# Patient Record
Sex: Female | Born: 1970 | Race: White | Hispanic: No | Marital: Married | Smoking: Never smoker
Health system: Southern US, Community
[De-identification: ages and names within clinical notes are randomized; demographics above are authoritative.]

---

## 1999-12-07 ENCOUNTER — Other Ambulatory Visit: Admission: RE | Admit: 1999-12-07 | Discharge: 1999-12-07 | Payer: Self-pay | Admitting: Obstetrics & Gynecology

## 2000-03-15 ENCOUNTER — Encounter: Admission: RE | Admit: 2000-03-15 | Discharge: 2000-06-13 | Payer: Self-pay | Admitting: Obstetrics & Gynecology

## 2000-04-21 ENCOUNTER — Inpatient Hospital Stay (HOSPITAL_COMMUNITY): Admission: AD | Admit: 2000-04-21 | Discharge: 2000-04-21 | Payer: Self-pay | Admitting: *Deleted

## 2000-06-06 ENCOUNTER — Inpatient Hospital Stay (HOSPITAL_COMMUNITY): Admission: AD | Admit: 2000-06-06 | Discharge: 2000-06-09 | Payer: Self-pay | Admitting: Obstetrics & Gynecology

## 2000-07-11 ENCOUNTER — Other Ambulatory Visit: Admission: RE | Admit: 2000-07-11 | Discharge: 2000-07-11 | Payer: Self-pay | Admitting: Obstetrics & Gynecology

## 2001-08-16 ENCOUNTER — Other Ambulatory Visit: Admission: RE | Admit: 2001-08-16 | Discharge: 2001-08-16 | Payer: Self-pay | Admitting: Obstetrics & Gynecology

## 2002-10-09 ENCOUNTER — Other Ambulatory Visit: Admission: RE | Admit: 2002-10-09 | Discharge: 2002-10-09 | Payer: Self-pay | Admitting: Obstetrics & Gynecology

## 2003-11-14 ENCOUNTER — Other Ambulatory Visit: Admission: RE | Admit: 2003-11-14 | Discharge: 2003-11-14 | Payer: Self-pay | Admitting: *Deleted

## 2008-05-14 ENCOUNTER — Emergency Department (HOSPITAL_COMMUNITY): Admission: EM | Admit: 2008-05-14 | Discharge: 2008-05-14 | Payer: Self-pay | Admitting: Emergency Medicine

## 2011-01-22 NOTE — H&P (Signed)
[facility]  Patient:    Katelyn Robertson, Katelyn Robertson                       MRN: [id number] Adm. Date:  [id number] Attending:  Genia Del                         History and Physical  DATE OF BIRTH:                1970-11-17  CHIEF COMPLAINT:              Ms. Littrell is a 40 year old G 2, P 0, A 1, at [redacted] weeks gestation.  Expected date of delivery June 19, 2000, by last a menstrual period.  REASON FOR ADMISSION:         Induction for intrauterine growth restriction at the 3rd to 4th percentile.  HISTORY OF PRESENT ILLNESS:   Fetal movements positive.  No vaginal bleeding. No fluid leak.  No irregular uterine contractions.  No PIH symptoms.  The patient was followed with fetal well-being test twice a week, alternating NSTs and biophysical profile.  Interval growths were done every two weeks, showing an estimated fetal weight at the 3rd percentile.  The baby showed reasonable interval growth.  The last ultrasound showed a 4th percentile.  The amniotic fluid was normal.  Dopplers were normal.  The placenta was grade 3.  The patient was put on relative bedrest with high p.o. hydration and high protein diet.  She had a very well-controlled gestational diabetes mellitus with values always below 90 fasting, and below 120 postprandial.  PAST MEDICAL HISTORY:         Negative.  PAST SURGICAL HISTORY:        Positive for D&E in 1992, for a therapeutic abortion.  No complications.  PAST OBSTETRICAL HISTORY:      Same as surgical history.  PAST GYNECOLOGIC HISTORY:     Negative.  CURRENT MEDICATIONS:          Prenatal vitamins.  ALLERGIES:                    No known drug allergies.  SOCIAL HISTORY:               Married, a nonsmoker.  HISTORY OF PRESENT PREGNANCY: First trimester was unremarkable.  Labs in the first trimester showed hemoglobin 12.7, platelets 273, blood type Rh, A-positive, Rh antibodies negative.  RPR nonreactive.  HbsAg negative.   HIV nonreactive.  Rubella titer immune.  Pap test within normal limits.  Gonorrhea and Chlamydia negative.  Triple test was within normal limits.  An ultrasound at 20 weeks showed a review of anatomy was within normal limits. Normal placenta, and normal amniotic fluid.  Cervix was 3.7 cm long.  A one-hour GTT was abnormal at 24+ weeks, a three-hour GTT was abnormal at 35+ weeks.  She was put on a diabetic diet and blood sugars were followed closely. They were within normal limits throughout her pregnancy.  At 31+ weeks gestation an ultrasound showed an estimated fetal weight in the 3rd percentile.  Dopplers and amniotic fluid index were within normal limits.  She was then followed as mentioned in the history of present illness.  Her group-B Streptococcus at 35+ weeks was negative.  REVIEW OF SYSTEMS:            CONSTITUTIONAL:  Negative.  HEENT:  Negative. RESPIRATORY:  Negative.  CARDIOVASCULAR:  Negative.  GI:  Negative.  UROLOGIC: Negative.  NEUROLOGIC:  Negative.  MUSCULOSKELETAL:  Negative.  DERMATOLOGIC: Negative.  PHYSICAL EXAMINATION:  GENERAL:                      In no apparent distress.  VITAL SIGNS:                  Blood pressure 118/89 on admission, then diastolic in the 60s, pulse 75, respirations 20, temperature 97.7 degrees.  HEENT:                        Within normal limits.  LUNGS:                        Clear bilaterally.  HEART:                        S1, S2 normal.  No S3, no S4, no murmur.  ABDOMEN:                      Gravid with a uterine height of 34.0 cm, nontender, vertex presentation.  PELVIC:                       Vaginal examination on admission was 1.0 cm long, vertex presentation, membranes intact.  EXTREMITIES:                  Lower limbs normal.  No edema.  NEUROLOGIC:                   No clonus.  MONITORING:                   Showed baseline fetal heart rate in the 125-135 range, good ___________, no decelerations.  No regular uterine  contractions.  IMPRESSION:                   A 40 year old gravida 2, para 0, abortion 1, at                               [redacted] weeks gestation, with gestational diabetes                               class A1, well-controlled, here for induction                               for intrauterine growth restriction at the                               third to fourth percentile.  PLAN:                         Admit to labor and delivery.  Monitoring. Induction with Pitocin and then artificial rupture of membranes.  Will follow closely, expectant management, with a probable vaginal delivery. DD:  06/06/00 TD:  06/06/00 Job: [id number]

## 2011-06-09 LAB — POCT URINALYSIS DIP (DEVICE)
Glucose, UA: NEGATIVE
Ketones, ur: NEGATIVE
Protein, ur: NEGATIVE
Specific Gravity, Urine: <=1.005
pH: 5.5

## 2016-11-29 DIAGNOSIS — Z01419 Encounter for gynecological examination (general) (routine) without abnormal findings: Secondary | ICD-10-CM | POA: Diagnosis not present

## 2016-11-29 DIAGNOSIS — Z6820 Body mass index (BMI) 20.0-20.9, adult: Secondary | ICD-10-CM | POA: Diagnosis not present

## 2017-06-13 ENCOUNTER — Ambulatory Visit (INDEPENDENT_AMBULATORY_CARE_PROVIDER_SITE_OTHER): Payer: 59

## 2017-06-13 ENCOUNTER — Ambulatory Visit (HOSPITAL_COMMUNITY)
Admission: EM | Admit: 2017-06-13 | Discharge: 2017-06-13 | Disposition: A | Discharge status: Home / Self Care | Payer: 59 | Attending: Internal Medicine | Admitting: Internal Medicine

## 2017-06-13 ENCOUNTER — Encounter (HOSPITAL_COMMUNITY): Payer: Self-pay | Admitting: Emergency Medicine

## 2017-06-13 DIAGNOSIS — M5431 Sciatica, right side: Secondary | ICD-10-CM

## 2017-06-13 DIAGNOSIS — M545 Low back pain: Secondary | ICD-10-CM | POA: Diagnosis not present

## 2017-06-13 MED ORDER — MELOXICAM 7.5 MG PO TABS: 7.5000 mg | ORAL_TABLET | Freq: Two times a day (BID) | ORAL | 0 refills | Status: AC | PRN

## 2017-06-13 MED ORDER — PREDNISONE 10 MG PO TABS: 40.0000 mg | ORAL_TABLET | Freq: Every day | ORAL | 0 refills | Status: AC

## 2017-06-13 NOTE — ED Triage Notes (Signed)
Pt here for right sided lower back pain into buttocks x 5 days; pt denies obvious injury

## 2017-12-12 ENCOUNTER — Telehealth: Payer: Self-pay | Admitting: *Deleted

## 2017-12-12 MED ORDER — NORETHIN-ETH ESTRAD TRIPHASIC 0.5/0.75/1-35 MG-MCG PO TABS: 1.0000 | ORAL_TABLET | Freq: Every day | ORAL | 1 refills | Status: DC

## 2017-12-12 NOTE — Telephone Encounter (Signed)
Patient has annual scheduled on 03/21/18, [facility] chart has arrived, needs refill on Nortel 7/7/7 0.5-0.75/1-35 mcg. Rx sent.

## 2018-02-10 ENCOUNTER — Encounter: Payer: Self-pay | Admitting: Obstetrics & Gynecology

## 2018-03-21 ENCOUNTER — Encounter: Payer: Self-pay | Admitting: Obstetrics & Gynecology

## 2018-04-04 ENCOUNTER — Ambulatory Visit (INDEPENDENT_AMBULATORY_CARE_PROVIDER_SITE_OTHER): Payer: 59 | Admitting: Obstetrics & Gynecology

## 2018-04-04 ENCOUNTER — Encounter: Payer: Self-pay | Admitting: Obstetrics & Gynecology

## 2018-04-04 VITALS — BP 112/76 | Ht 63.0 in | Wt 113.0 lb

## 2018-04-04 DIAGNOSIS — Z01419 Encounter for gynecological examination (general) (routine) without abnormal findings: Secondary | ICD-10-CM

## 2018-04-04 DIAGNOSIS — R6882 Decreased libido: Secondary | ICD-10-CM

## 2018-04-04 DIAGNOSIS — Z3041 Encounter for surveillance of contraceptive pills: Secondary | ICD-10-CM | POA: Diagnosis not present

## 2018-04-04 MED ORDER — NORETHIN-ETH ESTRAD TRIPHASIC 0.5/0.75/1-35 MG-MCG PO TABS: 1.0000 | ORAL_TABLET | Freq: Every day | ORAL | 4 refills | Status: DC

## 2018-04-04 NOTE — Progress Notes (Signed)
Katelyn NewerJessica Carnegie 10/08/1970 [id number]   History:    47 y.o. G2P1A1L1 Married.  Son is 46 yo, just graduated HS, will work in [employer], interior remodeling with his dad.   RP:  Established patient presenting for annual gyn exam   HPI: Well on Nortrel 777.  Light menses, no BTB.  No pelvic pain.  Normal vaginal secretions.  No pain with IC.  C/O low libido.  Urine/BMs wnl.  Breasts wnl.  BMI 20.02.  Health labs with Fam MD.  Past medical history,surgical history, family history and social history were all reviewed and documented in the EPIC chart.  Gynecologic History Patient's last menstrual period was 03/30/2018. Contraception: OCP (estrogen/progesterone) Last Pap: 11/2016. Results were: Negative/HPV HR neg Last mammogram: 11/2014. Results were: Negative.  Will schedule now. Bone Density: Never Colonoscopy: Never  Obstetric History OB History  Gravida Para Term Preterm AB Living  2 1     1 1   SAB TAB Ectopic Multiple Live Births               # Outcome Date GA Lbr Len/2nd Weight Sex Delivery Anes PTL Lv  2 AB           1 Para              ROS: A ROS was performed and pertinent positives and negatives are included in the history.  GENERAL: No fevers or chills. HEENT: No change in vision, no earache, sore throat or sinus congestion. NECK: No pain or stiffness. CARDIOVASCULAR: No chest pain or pressure. No palpitations. PULMONARY: No shortness of breath, cough or wheeze. GASTROINTESTINAL: No abdominal pain, nausea, vomiting or diarrhea, melena or bright red blood per rectum. GENITOURINARY: No urinary frequency, urgency, hesitancy or dysuria. MUSCULOSKELETAL: No joint or muscle pain, no back pain, no recent trauma. DERMATOLOGIC: No rash, no itching, no lesions. ENDOCRINE: No polyuria, polydipsia, no heat or cold intolerance. No recent change in weight. HEMATOLOGICAL: No anemia or easy bruising or bleeding. NEUROLOGIC: No headache, seizures, numbness, tingling or weakness.  PSYCHIATRIC: No depression, no loss of interest in normal activity or change in sleep pattern.     Exam:   BP 112/76   Ht 5\' 3"  (1.6 m)   Wt 113 lb (51.3 kg)   LMP 03/30/2018 Comment: pill  BMI 20.02 kg/m   Body mass index is 20.02 kg/m.  General appearance : Well developed well nourished female. No acute distress HEENT: Eyes: no retinal hemorrhage or exudates,  Neck supple, trachea midline, no carotid bruits, no thyroidmegaly Lungs: Clear to auscultation, no rhonchi or wheezes, or rib retractions  Heart: Regular rate and rhythm, no murmurs or gallops Breast:Examined in sitting and supine position were symmetrical in appearance, no palpable masses or tenderness,  no skin retraction, no nipple inversion, no nipple discharge, no skin discoloration, no axillary or supraclavicular lymphadenopathy Abdomen: no palpable masses or tenderness, no rebound or guarding Extremities: no edema or skin discoloration or tenderness  Pelvic: Vulva: Normal             Vagina: No gross lesions or discharge  Cervix: No gross lesions or discharge  Uterus  AV, normal size, shape and consistency, non-tender and mobile  Adnexa  Without masses or tenderness  Anus: Normal   Assessment/Plan:  47 y.o. female for annual exam   1. Well female exam with routine gynecological exam Normal gynecologic exam.  Pap negative with negative high-risk HPV in March 2018.  Will repeat Pap test at 2 to  3 years.  Breast exam normal.  Will schedule screening mammogram at [facility] or the breast center now.  Health labs with family physician.  2. Encounter for surveillance of contraceptive pills Well on Nortrel 777.  No contraindication to birth control pills.  Prescription sent to pharmacy.  3. Low libido Counseling done on low libido.  Encouraged to communicate with husband and assume leading role.  If no improvement, may consider stopping birth control pill and trying ParaGard IUD to avoid blocking ovulation.  Information  and pamphlet on ParaGard given to patient  Other orders - norethindrone-ethinyl estradiol (NORTREL 7/7/7) 0.5/0.75/1-35 MG-MCG tablet; Take 1 tablet by mouth daily.  Counseling on above issues and coordination of care more than 50% for 10 minutes.  Genia Del MD, 12:07 PM 04/04/2018

## 2018-04-04 NOTE — Patient Instructions (Signed)
1. Well female exam with routine gynecological exam Normal gynecologic exam.  Pap negative with negative high-risk HPV in March 2018.  Will repeat Pap test at 2 to 3 years.  Breast exam normal.  Will schedule [facility] or the [facility] center now.  Health labs with family physician.  2. Encounter for surveillance of contraceptive pills Well on Nortrel 777.  No contraindication to birth control pills.  Prescription sent to pharmacy.  3. Low libido Counseling done on low libido.  Encouraged to communicate with husband and assume leading role.  If no improvement, may consider stopping birth control pill and trying ParaGard IUD to avoid blocking ovulation.  Information and pamphlet on ParaGard given to patient  Other orders - norethindrone-ethinyl estradiol (NORTREL 7/7/7) 0.5/0.75/1-35 MG-MCG tablet; Take 1 tablet by mouth daily.  Katelyn Robertson, it was a pleasure seeing you today!

## 2018-05-09 ENCOUNTER — Encounter: Payer: Self-pay | Admitting: Obstetrics & Gynecology

## 2018-05-09 ENCOUNTER — Ambulatory Visit: Payer: 59 | Admitting: Obstetrics & Gynecology

## 2018-05-09 VITALS — BP 114/70

## 2018-05-09 DIAGNOSIS — R300 Dysuria: Secondary | ICD-10-CM

## 2018-05-09 DIAGNOSIS — R3915 Urgency of urination: Secondary | ICD-10-CM

## 2018-05-09 MED ORDER — CIPROFLOXACIN HCL 500 MG PO TABS: 500.0000 mg | ORAL_TABLET | Freq: Two times a day (BID) | ORAL | 0 refills | Status: DC

## 2018-05-09 NOTE — Progress Notes (Signed)
    Katelyn Robertson 1971/04/13 [id number]        47 y.o.  G2P0011 Married  RP:   HPI: Frequency, urgency of urination with discomfort and blood in urine x yesterday.  Pressure sensation.  No fever.  No vaginal discharge, no itching, no odor.  Has been more sexually active than usual recently.   OB History  Gravida Para Term Preterm AB Living  2 1     1 1   SAB TAB Ectopic Multiple Live Births               # Outcome Date GA Lbr Len/2nd Weight Sex Delivery Anes PTL Lv  2 AB           1 Para             Past medical history,surgical history, problem list, medications, allergies, family history and social history were all reviewed and documented in the EPIC chart.   Directed ROS with pertinent positives and negatives documented in the history of present illness/assessment and plan.  Exam:  Vitals:   05/09/18 1139  BP: 114/70   General appearance:  Normal  CVAT negative bilaterally  Gynecologic exam: Deferred  U/A: Yellow slightly cloudy, protein 1+, nitrites positive, white blood cells 40-60, red blood cells 40-60, moderate bacteria.  Urine culture pending.   Assessment/Plan:  47 y.o. G2P0011   1. Dysuria Acute cystitis clinically and per urine analysis.  No evidence of acute pyelonephritis.  Decision to treat with Cipro ofloxacin 500 mg twice a day for 7 days.  Usage discussed with patient and prescription sent to pharmacy.  Recommend increasing water consumption.  Urine culture pending. - Urinalysis,Complete w/RFL Culture  2. Urgency of urination As above. - Urinalysis,Complete w/RFL Culture  Other orders - ciprofloxacin (CIPRO) 500 MG tablet; Take 1 tablet (500 mg total) by mouth 2 (two) times daily.  Counseling on above issues and coordination of care more than 50% for 15 minutes.  Genia Del MD, 11:47 AM 05/09/2018

## 2018-05-09 NOTE — Patient Instructions (Signed)
1. Dysuria Acute cystitis clinically and per urine analysis.  No evidence of acute pyelonephritis.  Decision to treat with Cipro ofloxacin 500 mg twice a day for 7 days.  Usage discussed with patient and prescription sent to pharmacy.  Recommend increasing water consumption.  Urine culture pending. - Urinalysis,Complete w/RFL Culture  2. Urgency of urination As above. - Urinalysis,Complete w/RFL Culture  Other orders - ciprofloxacin (CIPRO) 500 MG tablet; Take 1 tablet (500 mg total) by mouth 2 (two) times daily.  Katelyn Robertson, it was a pleasure seeing you today!  I will inform you of the urine culture as soon as the results are available.

## 2018-05-11 LAB — URINALYSIS, COMPLETE W/RFL CULTURE
Bilirubin Urine: NEGATIVE
Glucose, UA: NEGATIVE
HYALINE CAST: NONE SEEN /LPF
Nitrites, Initial: POSITIVE — AB
Specific Gravity, Urine: 1.025 (ref 1.001–1.03)
pH: 5 (ref 5.0–8.0)

## 2018-05-11 LAB — URINE CULTURE
MICRO NUMBER:: 91055545
SPECIMEN QUALITY: ADEQUATE

## 2018-05-11 LAB — CULTURE INDICATED

## 2018-09-19 ENCOUNTER — Telehealth: Payer: Self-pay | Admitting: *Deleted

## 2018-09-19 NOTE — Telephone Encounter (Signed)
Patient called stating Nortel 7/7/03-06-34 mcg is on back order, I did confirm with the pharmacy. Pharmacist is not sure when medication will return, patient will need to be prescribed another pill. She asked if pill could be similar to Nortel has been on for several years.  Please advise

## 2018-09-20 NOTE — Telephone Encounter (Signed)
Tri-Norinyl 28, Necon 7/7/7 and if none of those are available Tri-Sprintec.  Since starting late back up this month.,  Use backup method/ condoms this month.

## 2018-09-20 NOTE — Telephone Encounter (Signed)
Spoke with patient and the pharmacy gave her a Rx for Necon 7/7/7, patient will continue Rx, will use back up condoms this month.

## 2018-09-20 NOTE — Telephone Encounter (Signed)
(   Dr. Seymour Bars patient)Nancy please see the below note, patient has been off pill since Sunday, do you have any recommendations?

## 2019-02-06 ENCOUNTER — Encounter: Payer: Self-pay | Admitting: Women's Health

## 2019-02-06 ENCOUNTER — Ambulatory Visit: Payer: 59 | Admitting: Women's Health

## 2019-02-06 ENCOUNTER — Other Ambulatory Visit: Payer: Self-pay

## 2019-02-06 VITALS — BP 138/82

## 2019-02-06 DIAGNOSIS — R350 Frequency of micturition: Secondary | ICD-10-CM | POA: Diagnosis not present

## 2019-02-06 DIAGNOSIS — N3001 Acute cystitis with hematuria: Secondary | ICD-10-CM | POA: Diagnosis not present

## 2019-02-06 MED ORDER — SULFAMETHOXAZOLE-TRIMETHOPRIM 800-160 MG PO TABS: 1.0000 | ORAL_TABLET | Freq: Two times a day (BID) | ORAL | 0 refills | Status: DC

## 2019-02-06 MED ORDER — NITROFURANTOIN MACROCRYSTAL 50 MG PO CAPS: ORAL_CAPSULE | ORAL | 0 refills | Status: DC

## 2019-02-06 NOTE — Patient Instructions (Signed)
OTC cranberry supp   Urinary Tract Infection, Adult A urinary tract infection (UTI) is an infection of any part of the urinary tract. The urinary tract includes:  The kidneys.  The ureters.  The bladder.  The urethra. These organs make, store, and get rid of pee (urine) in the body. What are the causes? This is caused by germs (bacteria) in your genital area. These germs grow and cause swelling (inflammation) of your urinary tract. What increases the risk? You are more likely to develop this condition if:  You have a small, thin tube (catheter) to drain pee.  You cannot control when you pee or poop (incontinence).  You are female, and: ? You use these methods to prevent pregnancy: ? A medicine that kills sperm (spermicide). ? A device that blocks sperm (diaphragm). ? You have low levels of a female hormone (estrogen). ? You are pregnant.  You have genes that add to your risk.  You are sexually active.  You take antibiotic medicines.  You have trouble peeing because of: ? A prostate that is bigger than normal, if you are female. ? A blockage in the part of your body that drains pee from the bladder (urethra). ? A kidney stone. ? A nerve condition that affects your bladder (neurogenic bladder). ? Not getting enough to drink. ? Not peeing often enough.  You have other conditions, such as: ? Diabetes. ? A weak disease-fighting system (immune system). ? Sickle cell disease. ? Gout. ? Injury of the spine. What are the signs or symptoms? Symptoms of this condition include:  Needing to pee right away (urgently).  Peeing often.  Peeing small amounts often.  Pain or burning when peeing.  Blood in the pee.  Pee that smells bad or not like normal.  Trouble peeing.  Pee that is cloudy.  Fluid coming from the vagina, if you are female.  Pain in the belly or lower back. Other symptoms include:  Throwing up (vomiting).  No urge to eat.  Feeling mixed up  (confused).  Being tired and grouchy (irritable).  A fever.  Watery poop (diarrhea). How is this treated? This condition may be treated with:  Antibiotic medicine.  Other medicines.  Drinking enough water. Follow these instructions at home:  Medicines  Take over-the-counter and prescription medicines only as told by your doctor.  If you were prescribed an antibiotic medicine, take it as told by your doctor. Do not stop taking it even if you start to feel better. General instructions  Make sure you: ? Pee until your bladder is empty. ? Do not hold pee for a long time. ? Empty your bladder after sex. ? Wipe from front to back after pooping if you are a female. Use each tissue one time when you wipe.  Drink enough fluid to keep your pee pale yellow.  Keep all follow-up visits as told by your doctor. This is important. Contact a doctor if:  You do not get better after 1-2 days.  Your symptoms go away and then come back. Get help right away if:  You have very bad back pain.  You have very bad pain in your lower belly.  You have a fever.  You are sick to your stomach (nauseous).  You are throwing up. Summary  A urinary tract infection (UTI) is an infection of any part of the urinary tract.  This condition is caused by germs in your genital area.  There are many risk factors for a UTI. These  include having a small, thin tube to drain pee and not being able to control when you pee or poop.  Treatment includes antibiotic medicines for germs.  Drink enough fluid to keep your pee pale yellow. This information is not intended to replace advice given to you by your health care provider. Make sure you discuss any questions you have with your health care provider. Document Released: 02/09/2008 Document Revised: 03/02/2018 Document Reviewed: 03/02/2018 Elsevier Interactive Patient Education  2019 [employer]

## 2019-02-06 NOTE — Progress Notes (Signed)
48 year old MWF G2, P1 presents with complaint of increased urinary frequency, urgency, pain at end of stream of urination with burning throughout urination for the past 2 days.  History of UTIs with similar symptoms.  Denies vaginal discharge, abdominal/back pain or fever.  Used over-the-counter Azo minimal relief.  Same partner married many years has frequent intercourse.  States relates to intercourse.  Last UTI September 2019 but states has had 3  UTIs s this past year.  Regular monthly cycle on Ortho-Novum 7/7/7, cycle just ended.  No known  medical problems.  Exam: Appears well.  No CVAT.  Abdomen soft without rebound. UA: Trace ketones, +1 leukocytes, +2 blood, greater than 60 WBCs, 20-40 RBCs, 20-40 squamous epithelials,  many bacteria.  Recurrent UTIs  Plan: Septra twice daily for 3 days prescription, proper use given.   UTI prevention discussed.  Macrodantin 50 mg as needed with intercourse, reviewed importance of emptying bladder after intercourse and increasing water consumption.  Instructed to call if no relief of symptoms.

## 2019-02-09 LAB — URINALYSIS, COMPLETE W/RFL CULTURE
Bilirubin Urine: NEGATIVE
Glucose, UA: NEGATIVE
Hyaline Cast: NONE SEEN /LPF
Nitrites, Initial: POSITIVE — AB
Specific Gravity, Urine: 1.025 (ref 1.001–1.03)
WBC, UA: >=60 /HPF — AB (ref 0–5)
pH: 5.5 (ref 5.0–8.0)

## 2019-02-09 LAB — URINE CULTURE
MICRO NUMBER:: 532609
SPECIMEN QUALITY:: ADEQUATE

## 2019-02-09 LAB — CULTURE INDICATED

## 2019-04-26 ENCOUNTER — Other Ambulatory Visit: Payer: Self-pay | Admitting: Obstetrics & Gynecology

## 2019-04-26 NOTE — Telephone Encounter (Signed)
Annual exam scheduled 05/24/19.

## 2019-05-02 ENCOUNTER — Other Ambulatory Visit: Payer: Self-pay

## 2019-05-02 ENCOUNTER — Ambulatory Visit: Payer: 59 | Admitting: Adult Health

## 2019-05-02 VITALS — BP 149/82 | HR 66 | Temp 98.5°F | Ht 63.25 in | Wt 121.4 lb

## 2019-05-02 DIAGNOSIS — L301 Dyshidrosis [pompholyx]: Secondary | ICD-10-CM | POA: Insufficient documentation

## 2019-05-02 DIAGNOSIS — Z0000 Encounter for general adult medical examination without abnormal findings: Secondary | ICD-10-CM

## 2019-05-02 MED ORDER — TRIAMCINOLONE ACETONIDE 0.1 % EX CREA
1.0000 | TOPICAL_CREAM | Freq: Two times a day (BID) | CUTANEOUS | 0 refills | Status: DC
Start: 2019-05-02 — End: 2019-07-31

## 2019-05-02 NOTE — Assessment & Plan Note (Signed)
Please use Triamcinolone 0.1% cream twice daily.

## 2019-05-02 NOTE — Patient Instructions (Addendum)
Mediterranean Diet A Mediterranean diet refers to food and lifestyle choices that are based on the traditions of countries located on the [employer]. This way of eating has been shown to help prevent certain conditions and improve outcomes for people who have chronic diseases, like kidney disease and heart disease. What are tips for following this plan? Lifestyle  Cook and eat meals together with your family, when possible.  Drink enough fluid to keep your urine clear or pale yellow.  Be physically active every day. This includes: ? Aerobic exercise like running or swimming. ? Leisure activities like gardening, walking, or housework.  Get 7-8 hours of sleep each night.  If recommended by your health care provider, drink red wine in moderation. This means 1 glass a day for nonpregnant women and 2 glasses a day for men. A glass of wine equals 5 oz (150 mL). Reading food labels   Check the serving size of packaged foods. For foods such as rice and pasta, the serving size refers to the amount of cooked product, not dry.  Check the total fat in packaged foods. Avoid foods that have saturated fat or trans fats.  Check the ingredients list for added sugars, such as corn syrup. Shopping  At the grocery store, buy most of your food from the areas near the walls of the store. This includes: ? Fresh fruits and vegetables (produce). ? Grains, beans, nuts, and seeds. Some of these may be available in unpackaged forms or large amounts (in bulk). ? Fresh seafood. ? Poultry and eggs. ? Low-fat dairy products.  Buy whole ingredients instead of prepackaged foods.  Buy fresh fruits and vegetables in-season from local farmers markets.  Buy frozen fruits and vegetables in resealable bags.  If you do not have access to quality fresh seafood, buy precooked frozen shrimp or canned fish, such as tuna, salmon, or sardines.  Buy small amounts of raw or cooked vegetables, salads, or olives from  the deli or salad bar at your store.  Stock your pantry so you always have certain foods on hand, such as olive oil, canned tuna, canned tomatoes, rice, pasta, and beans. Cooking  Cook foods with extra-virgin olive oil instead of using butter or other vegetable oils.  Have meat as a side dish, and have vegetables or grains as your main dish. This means having meat in small portions or adding small amounts of meat to foods like pasta or stew.  Use beans or vegetables instead of meat in common dishes like chili or lasagna.  Experiment with different cooking methods. Try roasting or broiling vegetables instead of steaming or sauteing them.  Add frozen vegetables to soups, stews, pasta, or rice.  Add nuts or seeds for added healthy fat at each meal. You can add these to yogurt, salads, or vegetable dishes.  Marinate fish or vegetables using olive oil, lemon juice, garlic, and fresh herbs. Meal planning   Plan to eat 1 vegetarian meal one day each week. Try to work up to 2 vegetarian meals, if possible.  Eat seafood 2 or more times a week.  Have healthy snacks readily available, such as: ? Vegetable sticks with hummus. ? [place] yogurt. ? Fruit and nut trail mix.  Eat balanced meals throughout the week. This includes: ? Fruit: 2-3 servings a day ? Vegetables: 4-5 servings a day ? Low-fat dairy: 2 servings a day ? Fish, poultry, or lean meat: 1 serving a day ? Beans and legumes: 2 or more servings a week ?  Nuts and seeds: 1-2 servings a day ? Whole grains: 6-8 servings a day ? Extra-virgin olive oil: 3-4 servings a day  Limit red meat and sweets to only a few servings a month What are my food choices?  Mediterranean diet ? Recommended  Grains: Whole-grain pasta. Brown rice. Bulgar wheat. Polenta. Couscous. Whole-wheat bread. [place].  Vegetables: Artichokes. Beets. Broccoli. Cabbage. Carrots. Eggplant. Green beans. Chard. Kale. Spinach. Onions. Leeks. Peas. Squash.  Tomatoes. Peppers. Radishes.  Fruits: Apples. Apricots. Avocado. Berries. Bananas. Cherries. Dates. Figs. Grapes. Lemons. Melon. Oranges. Peaches. Plums. Pomegranate.  Meats and other protein foods: Beans. Almonds. Sunflower seeds. Pine nuts. Peanuts. [place]. Salmon. Scallops. Shrimp. [place]. Tilapia. Clams. Oysters. Eggs.  Dairy: Low-fat milk. Cheese. Greek yogurt.  Beverages: Water. Red wine. Herbal tea.  Fats and oils: Extra virgin olive oil. Avocado oil. Grape seed oil.  Sweets and desserts: [place] yogurt with honey. Baked apples. Poached pears. Trail mix.  Seasoning and other foods: Basil. Cilantro. Coriander. Cumin. Mint. Parsley. Sage. Rosemary. Tarragon. Garlic. Oregano. Thyme. Pepper. Balsalmic vinegar. Tahini. Hummus. Tomato sauce. Olives. Mushrooms. ? Limit these  Grains: Prepackaged pasta or rice dishes. Prepackaged cereal with added sugar.  Vegetables: Deep fried potatoes (french fries).  Fruits: Fruit canned in syrup.  Meats and other protein foods: Beef. Pork. Lamb. Poultry with skin. Hot dogs. [place] Salines.  Dairy: Ice cream. Sour cream. Whole milk.  Beverages: Juice. Sugar-sweetened soft drinks. Beer. Liquor and spirits.  Fats and oils: Butter. Canola oil. Vegetable oil. Beef fat (tallow). Lard.  Sweets and desserts: Cookies. Cakes. Pies. Candy.  Seasoning and other foods: Mayonnaise. Premade sauces and marinades. The items listed may not be a complete list. Talk with your dietitian about what dietary choices are right for you. Summary  The Mediterranean diet includes both food and lifestyle choices.  Eat a variety of fresh fruits and vegetables, beans, nuts, seeds, and whole grains.  Limit the amount of red meat and sweets that you eat.  Talk with your health care provider about whether it is safe for you to drink red wine in moderation. This means 1 glass a day for nonpregnant women and 2 glasses a day for men. A glass of wine equals 5 oz (150 mL). This information  is not intended to replace advice given to you by your health care provider. Make sure you discuss any questions you have with your health care provider. Document Released: 04/15/2016 Document Revised: 04/22/2016 Document Reviewed: 04/15/2016 Elsevier Patient Education  [place].  Increase water intake, strive for at least 65 ounces/day.   Follow Mediterranean diet Increase regular exercise.  Recommend at least 30 minutes daily, 5 days per week of walking, jogging, biking, swimming, YouTube/Pinterest workout videos. Please use Triamcinolone 0.1% cream twice daily. Please schedule physical in 4 weeks, fasting labs in 3 weeks. Continue to social distance and wear a mask when in public. WELCOME TO THE PRACTICE!

## 2019-05-02 NOTE — Assessment & Plan Note (Signed)
Increase water intake, strive for at least 65 ounces/day.   Follow Mediterranean diet Increase regular exercise.  Recommend at least 30 minutes daily, 5 days per week of walking, jogging, biking, swimming, YouTube/Pinterest workout videos. Please use Triamcinolone 0.1% cream twice daily. Please schedule physical in 4 weeks, fasting labs in 3 weeks. Continue to social distance and wear a mask when in public.

## 2019-05-02 NOTE — Progress Notes (Signed)
Subjective:    Patient ID: Katelyn Robertson, female    DOB: 08/28/1971, 48 y.o.   MRN: [id number]  HPI: Katelyn Robertson is here to establish as a new pt.  She is a pleasant 48 year old female. PMH: Elevated BP without dx of HTN, re-current UTI. She has had 3 UTIs in last 12 month period.  She is completing 3rd rd of ABXs now.  If she had another UTI, will refer to Urology. She estimates to only drink sips of water/day, prefers to hydrate on sweat tea and Mt. Dew. She eats a diet high in processed foods She denies regular exercise  She abstains from tobacco/vape/ETOH She has not had primary care in past, only regular OB/GYN care. On acute complaint- flaky, itchy skin on R foot x 1 month Patient Care Team    Relationship Specialty Notifications Start End  Esaw Grandchild, NP PCP - General Family Medicine  05/02/19     Patient Active Problem List   Diagnosis Date Noted  . Healthcare maintenance 05/02/2019  . Dyshidrotic foot dermatitis 05/02/2019     No past medical history on file.   No past surgical history on file.   Family History  Problem Relation Age of Onset  . Hypertension Father   . Breast cancer Maternal Grandmother      Social History   Substance and Sexual Activity  Drug Use No     Social History   Substance and Sexual Activity  Alcohol Use No     Social History   Tobacco Use  Smoking Status Never Smoker  Smokeless Tobacco Never Used     Outpatient Encounter Medications as of 05/02/2019  Medication Sig  . [name] 7/7/7 0.5/0.75/1-35 MG-MCG tablet Take 1 tablet by mouth once daily  . triamcinolone cream (KENALOG) 0.1 % Apply 1 application topically 2 (two) times daily.  . [DISCONTINUED] nitrofurantoin (MACRODANTIN) 50 MG capsule Take as needed with intercourse  . [DISCONTINUED] sulfamethoxazole-trimethoprim (BACTRIM DS) 800-160 MG tablet Take 1 tablet by mouth 2 (two) times daily.   No facility-administered encounter medications on file as of  05/02/2019.     Allergies: Patient has no known allergies.  Body mass index is 21.34 kg/m.  Blood pressure (!) 149/82, pulse 66, temperature 98.5 F (36.9 C), temperature source Oral, height 5' 3.25" (1.607 m), weight 121 lb 6.4 oz (55.1 kg), last menstrual period 04/22/2019, SpO2 100 %.   Review of Systems  Constitutional: Positive for fatigue. Negative for activity change, appetite change, chills, diaphoresis, fever and unexpected weight change.  HENT: Negative for congestion.   Eyes: Negative for visual disturbance.  Respiratory: Negative for cough, chest tightness, shortness of breath, wheezing and stridor.   Cardiovascular: Negative for chest pain, palpitations and leg swelling.  Gastrointestinal: Negative for abdominal distention, abdominal pain, blood in stool, constipation, diarrhea, nausea and vomiting.  Endocrine: Negative for cold intolerance, heat intolerance, polydipsia, polyphagia and polyuria.  Genitourinary: Negative for difficulty urinating and flank pain.  Musculoskeletal: Negative for arthralgias, back pain, gait problem, joint swelling, myalgias, neck pain and neck stiffness.  Skin: Positive for color change and rash. Negative for pallor and wound.  Neurological: Negative for dizziness and headaches.  Hematological: Negative for adenopathy. Does not bruise/bleed easily.  Psychiatric/Behavioral: Negative for agitation, behavioral problems, confusion, decreased concentration, dysphoric mood, hallucinations, self-injury, sleep disturbance and suicidal ideas. The patient is not nervous/anxious and is not hyperactive.        Objective:   Physical Exam Vitals signs and nursing note  reviewed.  Constitutional:      General: She is not in acute distress.    Appearance: Normal appearance. She is normal weight. She is not ill-appearing, toxic-appearing or diaphoretic.  HENT:     Head: Normocephalic and atraumatic.  Eyes:     Extraocular Movements: Extraocular movements  intact.     Conjunctiva/sclera: Conjunctivae normal.     Pupils: Pupils are equal, round, and reactive to light.  Cardiovascular:     Rate and Rhythm: Normal rate and regular rhythm.     Pulses: Normal pulses.     Heart sounds: Normal heart sounds. No murmur. No friction rub. No gallop.   Pulmonary:     Effort: Pulmonary effort is normal. No respiratory distress.     Breath sounds: Normal breath sounds. No stridor. No wheezing, rhonchi or rales.  Chest:     Chest wall: No tenderness.  Skin:    General: Skin is warm.     Capillary Refill: Capillary refill takes less than 2 seconds.     Findings: Erythema present.     Comments: R foot- erythema, peeling skin, papules     Neurological:     Mental Status: She is alert and oriented to person, place, and time.  Psychiatric:        Mood and Affect: Mood normal.        Behavior: Behavior normal.        Thought Content: Thought content normal.        Judgment: Judgment normal.           Assessment & Plan:   1. Healthcare maintenance   2. Dyshidrotic foot dermatitis     Healthcare maintenance Increase water intake, strive for at least 65 ounces/day.   Follow Mediterranean diet Increase regular exercise.  Recommend at least 30 minutes daily, 5 days per week of walking, jogging, biking, swimming, YouTube/Pinterest workout videos. Please use Triamcinolone 0.1% cream twice daily. Please schedule physical in 4 weeks, fasting labs in 3 weeks. Continue to social distance and wear a mask when in public.  Dyshidrotic foot dermatitis Please use Triamcinolone 0.1% cream twice daily.     FOLLOW-UP:  Return in about 4 weeks (around 05/30/2019) for CPE, Fasting Labs.

## 2019-05-08 ENCOUNTER — Other Ambulatory Visit: Payer: Self-pay

## 2019-05-08 ENCOUNTER — Encounter: Payer: Self-pay | Admitting: Obstetrics & Gynecology

## 2019-05-08 ENCOUNTER — Ambulatory Visit: Payer: 59 | Admitting: Obstetrics & Gynecology

## 2019-05-08 VITALS — BP 116/72

## 2019-05-08 DIAGNOSIS — N3000 Acute cystitis without hematuria: Secondary | ICD-10-CM

## 2019-05-08 DIAGNOSIS — R350 Frequency of micturition: Secondary | ICD-10-CM | POA: Diagnosis not present

## 2019-05-08 MED ORDER — CIPROFLOXACIN HCL 500 MG PO TABS: 500.0000 mg | ORAL_TABLET | Freq: Two times a day (BID) | ORAL | 0 refills | Status: AC

## 2019-05-08 MED ORDER — NITROFURANTOIN MONOHYD MACRO 100 MG PO CAPS: 100.0000 mg | ORAL_CAPSULE | Freq: Every day | ORAL | 12 refills | Status: AC

## 2019-05-08 NOTE — Patient Instructions (Signed)
1. Frequency of urination Acute cystitis per clinical symptoms and urine analysis results.  Urine culture pending.  Fourth episode in 1 year.  Will treat with ciprofloxacin 500 mg per mouth twice a day for 7 days.  Will then start on Macrobid prophylaxis 100 mg daily.  Usage reviewed and prescription sent to pharmacy. - Urinalysis,Complete w/RFL Culture  2. Urinary frequency As above.  3. Acute recurrent cystitis Decision to start on Macrobid prophylaxis 100 mg per mouth daily.  Prescription sent to pharmacy.  Postcoital recommendation reviewed with patient.  Other orders - ciprofloxacin (CIPRO) 500 MG tablet; Take 1 tablet (500 mg total) by mouth 2 (two) times daily for 7 days. - nitrofurantoin, macrocrystal-monohydrate, (MACROBID) 100 MG capsule; Take 1 capsule (100 mg total) by mouth daily.  Katelyn Robertson, it was a pleasure seeing you today!  I will inform you of your results as soon as they are available.

## 2019-05-08 NOTE — Progress Notes (Signed)
    Katelyn Robertson [birth year] [id number]        48 y.o.  G2P0011 Married  RP: Urinary frequency, urgency, pelvic pain x x a few days  HPI: 3 acute cystitis in the last year, last cystitis 02/2019 E. Coli.  Sexually active every 2 days.  C/O urinary frequency, urgency and pelvic pressure x a few days.  No blood in urine.  No fever.   OB History  Gravida Para Term Preterm AB Living  2 1     1 1   SAB TAB Ectopic Multiple Live Births               # Outcome Date GA Lbr Len/2nd Weight Sex Delivery Anes PTL Lv  2 AB           1 Para             Past medical history,surgical history, problem list, medications, allergies, family history and social history were all reviewed and documented in the EPIC chart.   Directed ROS with pertinent positives and negatives documented in the history of present illness/assessment and plan.  Exam:  Vitals:   05/08/19 1226  BP: 116/72   General appearance:  Normal  CVAT Negative bilaterally  Abdomen: Normal  Gynecologic exam: Deferred  U/A: Yellow clear, protein 1+, nitrites negative, white blood cells 40-60, red blood cells 10-20, moderate bacteria.  Urine culture pending.   Assessment/Plan:  48 y.o. G2P0011   1. Frequency of urination Acute cystitis per clinical symptoms and urine analysis results.  Urine culture pending.  Fourth episode in 1 year.  Will treat with ciprofloxacin 500 mg per mouth twice a day for 7 days.  Will then start on Macrobid prophylaxis 100 mg daily.  Usage reviewed and prescription sent to pharmacy. - Urinalysis,Complete w/RFL Culture  2. Urinary frequency As above.  3. Acute recurrent cystitis Decision to start on Macrobid prophylaxis 100 mg per mouth daily.  Prescription sent to pharmacy.  Postcoital recommendation reviewed with patient.  Other orders - ciprofloxacin (CIPRO) 500 MG tablet; Take 1 tablet (500 mg total) by mouth 2 (two) times daily for 7 days. - nitrofurantoin, macrocrystal-monohydrate,  (MACROBID) 100 MG capsule; Take 1 capsule (100 mg total) by mouth daily.  Counseling on above issues and coordination of care more than 50% for 25 minutes.  Princess Bruins MD, 12:32 PM 05/08/2019

## 2019-05-10 LAB — URINALYSIS, COMPLETE W/RFL CULTURE
Bilirubin Urine: NEGATIVE
Glucose, UA: NEGATIVE
Hyaline Cast: NONE SEEN /LPF
Ketones, ur: NEGATIVE
Nitrites, Initial: NEGATIVE
Specific Gravity, Urine: 1.025 (ref 1.001–1.03)
pH: 5 (ref 5.0–8.0)

## 2019-05-10 LAB — URINE CULTURE
MICRO NUMBER:: 839308
SPECIMEN QUALITY:: ADEQUATE

## 2019-05-10 LAB — CULTURE INDICATED

## 2019-05-16 ENCOUNTER — Encounter: Payer: Self-pay | Admitting: Adult Health

## 2019-05-23 ENCOUNTER — Other Ambulatory Visit: Payer: Self-pay

## 2019-05-24 ENCOUNTER — Ambulatory Visit (INDEPENDENT_AMBULATORY_CARE_PROVIDER_SITE_OTHER): Payer: 59 | Admitting: Obstetrics & Gynecology

## 2019-05-24 ENCOUNTER — Encounter: Payer: Self-pay | Admitting: Obstetrics & Gynecology

## 2019-05-24 VITALS — BP 118/70 | Ht 63.0 in | Wt 121.0 lb

## 2019-05-24 DIAGNOSIS — Z3041 Encounter for surveillance of contraceptive pills: Secondary | ICD-10-CM | POA: Diagnosis not present

## 2019-05-24 DIAGNOSIS — N3000 Acute cystitis without hematuria: Secondary | ICD-10-CM | POA: Diagnosis not present

## 2019-05-24 DIAGNOSIS — Z01419 Encounter for gynecological examination (general) (routine) without abnormal findings: Secondary | ICD-10-CM | POA: Diagnosis not present

## 2019-05-24 MED ORDER — PIRMELLA 7/7/7 0.5/0.75/1-35 MG-MCG PO TABS: 1.0000 | ORAL_TABLET | Freq: Every day | ORAL | 4 refills | Status: DC

## 2019-05-24 NOTE — Patient Instructions (Signed)
1. Encounter for routine gynecological examination with Papanicolaou smear of cervix Normal gynecologic exam.  Pap reflex done.  Breast exam normal.  Will schedule a screening mammogram at the breast center now.  Good body mass index at 21.43.  Recommend aerobic physical activities 5 times a week and weightlifting every 2 days.  Healthy nutrition.  Health labs with family nurse practitioner.  2. Encounter for surveillance of contraceptive pills Well on [name] 7/7/7.  No breakthrough bleeding.  No contraindication to continue.  Prescription sent to pharmacy.  3. Acute recurrent cystitis Well on Macrobid 100 mg 1 tablet daily for prophylaxis.  Other orders - norethindrone-ethinyl estradiol ([name] 7/7/7) 0.5/0.75/1-35 MG-MCG tablet; Take 1 tablet by mouth daily.  Katelyn Robertson, it was a pleasure seeing you today!  I will inform you of your results as soon as they are available.

## 2019-05-24 NOTE — Progress Notes (Signed)
Linzee Depaul 1970/10/04 [id number]   History:    48 y.o. G2P1A1L1 Married.  Son is 13 yo, working with his father, living with his girlfriend in his parents' basement.  RP:  Established patient presenting for annual gyn exam   HPI: Well on [place] 7/7/7.  No BTB.  No pelvic pain.  No bladder infection since started on ABprophylaxis.  No pain with IC.  Breasts normal.  BMI 21.43.  Labs with Fam NP.  Past medical history,surgical history, family history and social history were all reviewed and documented in the EPIC chart.  Gynecologic History Patient's last menstrual period was 05/20/2019. Contraception: OCP (estrogen/progesterone) Last Pap: 11/2016. Results were: Negative/HPV HR neg Last mammogram: 11/2014. Results were: Negative Bone Density: Never Colonoscopy: Never  Obstetric History OB History  Gravida Para Term Preterm AB Living  2 1     1 1   SAB TAB Ectopic Multiple Live Births               # Outcome Date GA Lbr Len/2nd Weight Sex Delivery Anes PTL Lv  2 AB           1 Para              ROS: A ROS was performed and pertinent positives and negatives are included in the history.  GENERAL: No fevers or chills. HEENT: No change in vision, no earache, sore throat or sinus congestion. NECK: No pain or stiffness. CARDIOVASCULAR: No chest pain or pressure. No palpitations. PULMONARY: No shortness of breath, cough or wheeze. GASTROINTESTINAL: No abdominal pain, nausea, vomiting or diarrhea, melena or bright red blood per rectum. GENITOURINARY: No urinary frequency, urgency, hesitancy or dysuria. MUSCULOSKELETAL: No joint or muscle pain, no back pain, no recent trauma. DERMATOLOGIC: No rash, no itching, no lesions. ENDOCRINE: No polyuria, polydipsia, no heat or cold intolerance. No recent change in weight. HEMATOLOGICAL: No anemia or easy bruising or bleeding. NEUROLOGIC: No headache, seizures, numbness, tingling or weakness. PSYCHIATRIC: No depression, no loss of interest in normal  activity or change in sleep pattern.     Exam:   BP 118/70   Ht 5\' 3"  (1.6 m)   Wt 121 lb (54.9 kg)   LMP 05/20/2019   BMI 21.43 kg/m   Body mass index is 21.43 kg/m.  General appearance : Well developed well nourished female. No acute distress HEENT: Eyes: no retinal hemorrhage or exudates,  Neck supple, trachea midline, no carotid bruits, no thyroidmegaly Lungs: Clear to auscultation, no rhonchi or wheezes, or rib retractions  Heart: Regular rate and rhythm, no murmurs or gallops Breast:Examined in sitting and supine position were symmetrical in appearance, no palpable masses or tenderness,  no skin retraction, no nipple inversion, no nipple discharge, no skin discoloration, no axillary or supraclavicular lymphadenopathy Abdomen: no palpable masses or tenderness, no rebound or guarding Extremities: no edema or skin discoloration or tenderness  Pelvic: Vulva: Normal             Vagina: No gross lesions or discharge  Cervix: No gross lesions or discharge.  Pap reflex done.  Uterus  AV, normal size, shape and consistency, non-tender and mobile  Adnexa  Without masses or tenderness  Anus: Normal   Assessment/Plan:  48 y.o. female for annual exam   1. Encounter for routine gynecological examination with Papanicolaou smear of cervix Normal gynecologic exam.  Pap reflex done.  Breast exam normal.  Will schedule a screening mammogram at the breast center now.  Good body mass  index at 21.43.  Recommend aerobic physical activities 5 times a week and weightlifting every 2 days.  Healthy nutrition.  Health labs with family nurse practitioner.  2. Encounter for surveillance of contraceptive pills Well on [name] 7/7/7.  No breakthrough bleeding.  No contraindication to continue.  Prescription sent to pharmacy.  3. Acute recurrent cystitis Well on Macrobid 100 mg 1 tablet daily for prophylaxis.  Other orders - norethindrone-ethinyl estradiol ([name] 7/7/7) 0.5/0.75/1-35 MG-MCG  tablet; Take 1 tablet by mouth daily.  Genia DelMarie-Lyne Gaither Biehn MD, 2:08 PM 05/24/2019

## 2019-05-24 NOTE — Addendum Note (Signed)
Addended by: Thurnell Garbe A on: 05/24/2019 03:23 PM   Modules accepted: Orders

## 2019-05-25 LAB — PAP IG W/ RFLX HPV ASCU

## 2019-06-14 NOTE — ED Provider Notes (Signed)
[facility]    CSN: [id number] Arrival date & time: 06/13/17  1645      History   Chief Complaint Chief Complaint  Patient presents with  . Back Pain    HPI Katelyn Robertson is a 48 y.o. female.   Pt with no chronic medical prolems c/o lower back pain x5 days.  Denies trauma or significant change in activity.  Denies LE weakness or loss of B/B control.      History reviewed. No pertinent past medical history.  Patient Active Problem List   Diagnosis Date Noted  . Healthcare maintenance 05/02/2019  . 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 No pertinent surgical history.  OB History    Gravida  2   Para  1   Term      Preterm      AB  1   Living  1     SAB      TAB      Ectopic      Multiple      Live Births               Home Medications    Prior to Admission medications   Medication Sig Start Date End Date Taking? Authorizing Provider  norethindrone-ethinyl estradiol ([name] 7/7/7) 0.5/0.75/1-35 MG-MCG tablet Take 1 tablet by mouth daily. 05/24/19   Genia Del, MD  triamcinolone cream (KENALOG) 0.1 % Apply 1 application topically 2 (two) times daily. Patient not taking: Reported on 05/24/2019 05/02/19   Julaine Fusi, NP    Family History Family History  Problem Relation Age of Onset  . Hypertension Father   . Breast cancer Maternal Grandmother     Social History Social History   Tobacco Use  . Smoking status: Never Smoker  . Smokeless tobacco: Never Used  Substance Use Topics  . Alcohol use: No  . Drug use: No     Allergies   Patient has no known allergies.   Review of Systems Review of Systems  Constitutional: Negative for chills and fever.  HENT: Negative for sore throat and tinnitus.   Eyes: Negative for redness.  Respiratory: Negative for cough and shortness of breath.   Cardiovascular: Negative for chest pain and palpitations.  Gastrointestinal: Negative for abdominal  pain, diarrhea, nausea and vomiting.  Genitourinary: Negative for dysuria, frequency and urgency.  Musculoskeletal: Positive for back pain. Negative for myalgias.  Skin: Negative for rash.       No lesions  Neurological: Negative for weakness.  Hematological: Does not bruise/bleed easily.  Psychiatric/Behavioral: Negative for suicidal ideas.     Physical Exam Triage Vital Signs ED Triage Vitals  Enc Vitals Group     BP 06/13/17 1716 (!) 142/71     Pulse Rate 06/13/17 1716 71     Resp 06/13/17 1716 18     Temp 06/13/17 1716 99.1 F (37.3 C)     Temp Source 06/13/17 1716 Oral     SpO2 06/13/17 1716 100 %     Weight --      Height --      Head Circumference --      Peak Flow --      Pain Score 06/13/17 1717 8     Pain Loc --      Pain Edu? --      Excl. in GC? --    No data found.  Updated Vital Signs BP (!) 142/71 (BP Location: Right Arm)   Pulse 71  Temp 99.1 F (37.3 C) (Oral)   Resp 18   SpO2 100%   Visual Acuity Right Eye Distance:   Left Eye Distance:   Bilateral Distance:    Right Eye Near:   Left Eye Near:    Bilateral Near:     Physical Exam Vitals signs and nursing note reviewed.  Constitutional:      General: She is not in acute distress.    Appearance: She is well-developed.  HENT:     Head: Normocephalic and atraumatic.  Eyes:     General: No scleral icterus.    Conjunctiva/sclera: Conjunctivae normal.     Pupils: Pupils are equal, round, and reactive to light.  Neck:     Musculoskeletal: Normal range of motion and neck supple.     Thyroid: No thyromegaly.     Vascular: No JVD.     Trachea: No tracheal deviation.  Cardiovascular:     Rate and Rhythm: Normal rate and regular rhythm.     Heart sounds: Normal heart sounds. No murmur. No friction rub. No gallop.   Pulmonary:     Effort: Pulmonary effort is normal.     Breath sounds: Normal breath sounds.  Abdominal:     General: Bowel sounds are normal. There is no distension.      Palpations: Abdomen is soft.     Tenderness: There is no abdominal tenderness.  Musculoskeletal: Normal range of motion.  Lymphadenopathy:     Cervical: No cervical adenopathy.  Skin:    General: Skin is warm and dry.  Neurological:     Mental Status: She is alert and oriented to person, place, and time.     Cranial Nerves: No cranial nerve deficit.     Comments: Positive straight leg test on the right  Psychiatric:        Behavior: Behavior normal.        Thought Content: Thought content normal.        Judgment: Judgment normal.      UC Treatments / Results  Labs (all labs ordered are listed, but only abnormal results are displayed) Labs Reviewed - No data to display  EKG   Radiology No results found.  Procedures Procedures (including critical care time)  Medications Ordered in UC Medications - No data to display  Initial Impression / Assessment and Plan / UC Course  I have reviewed the triage vital signs and the nursing notes.  Pertinent labs & imaging results that were available during my care of the patient were reviewed by me and considered in my medical decision making (see chart for details).     Concern for buldging disc resulting in sciatica. Trial of NSAID and prednisone.   Final Clinical Impressions(s) / UC Diagnoses   Final diagnoses:  Sciatica of right side   Discharge Instructions   None    ED Prescriptions    Medication Sig Dispense Auth. Provider   meloxicam (MOBIC) 7.5 MG tablet Take 1 tablet (7.5 mg total) by mouth 2 (two) times daily as needed for pain. Drink plenty of water while taking this medication 14 tablet Harrie Foreman, MD   predniSONE (DELTASONE) 10 MG tablet Take 4 tablets (40 mg total) by mouth daily with breakfast. 16 tablet Harrie Foreman, MD     PDMP not reviewed this encounter.   Harrie Foreman, MD 06/14/19 [phone]

## 2019-07-04 IMAGING — DX DG LUMBAR SPINE COMPLETE 4+V
5 series · 5 of 5 positions shown · non-contrast
Comparison: None.

CLINICAL DATA: Right lower back pain that radiates into the right
leg.

EXAM:
LUMBAR SPINE - COMPLETE 4+ VIEW

[l-spine ap]
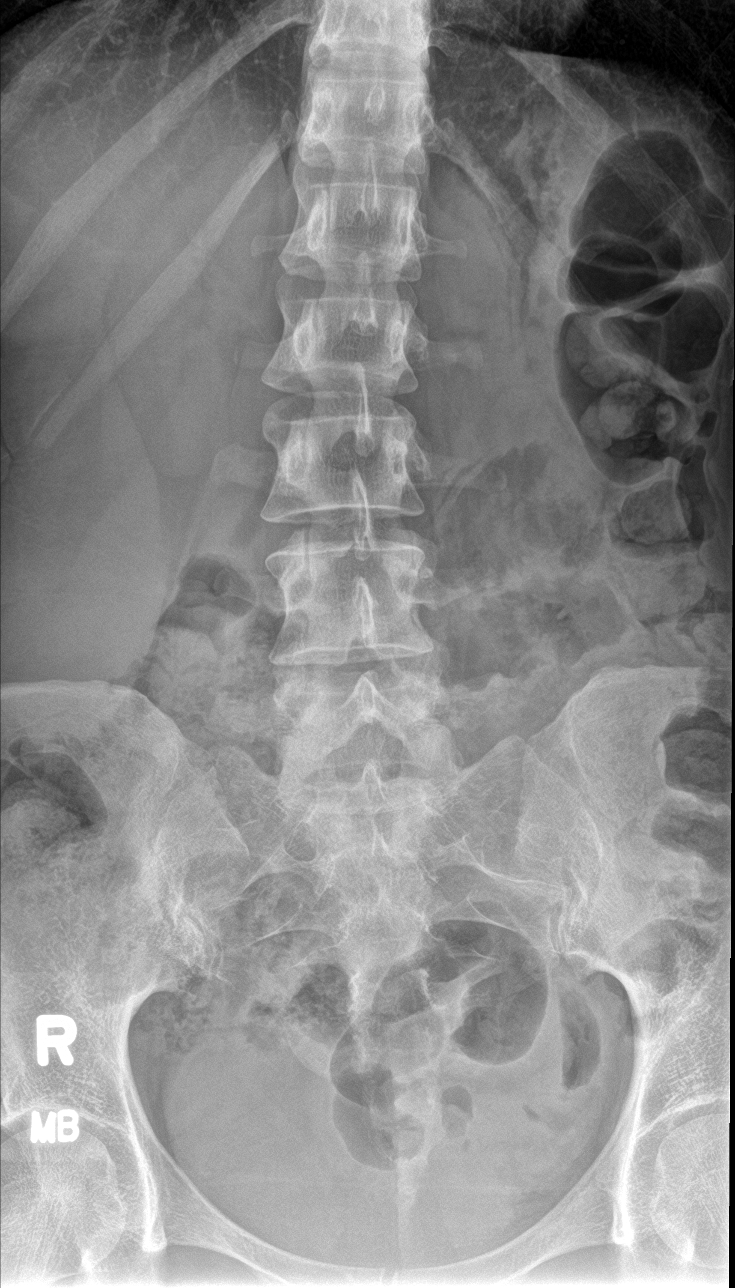

[l-spine obl (1 of 2)]
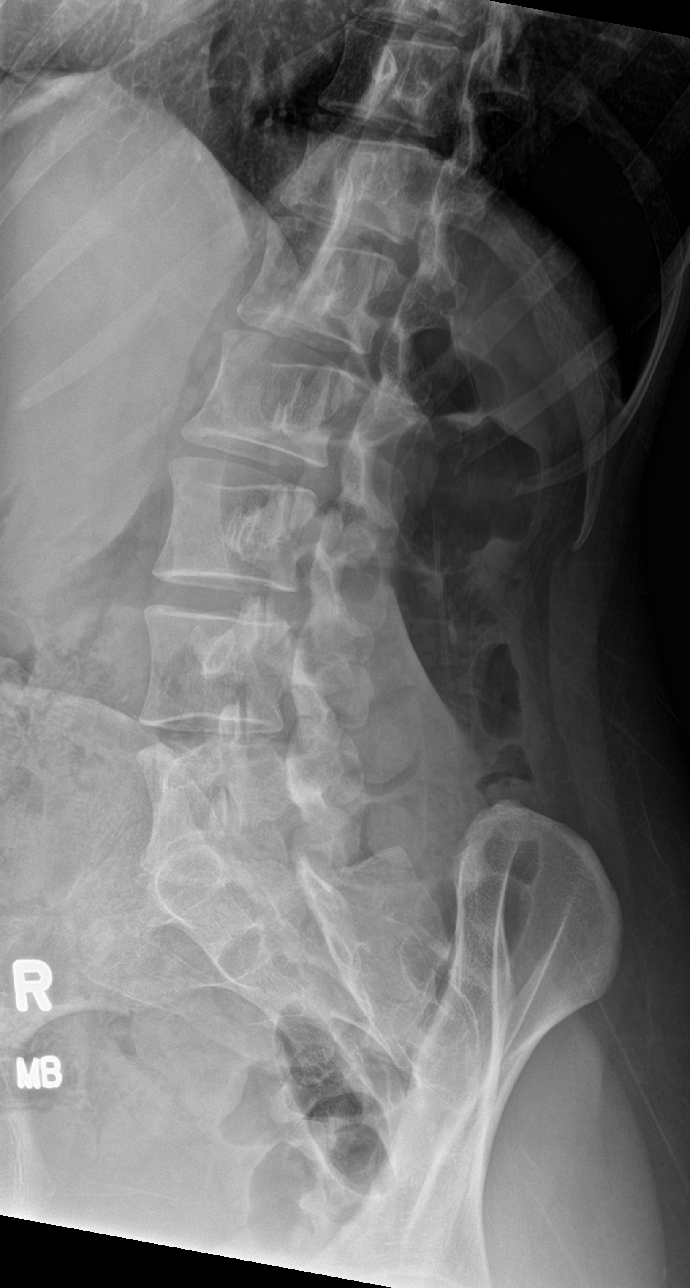

[l-spine obl (2 of 2)]
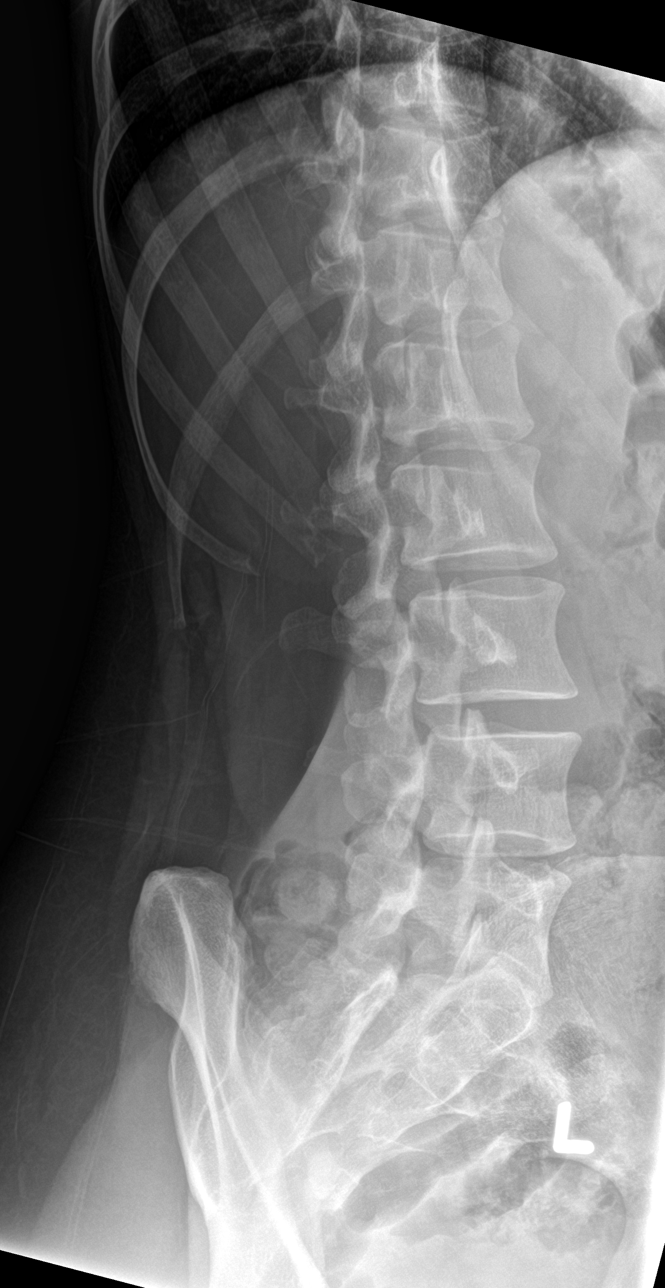

[l-spine lat]
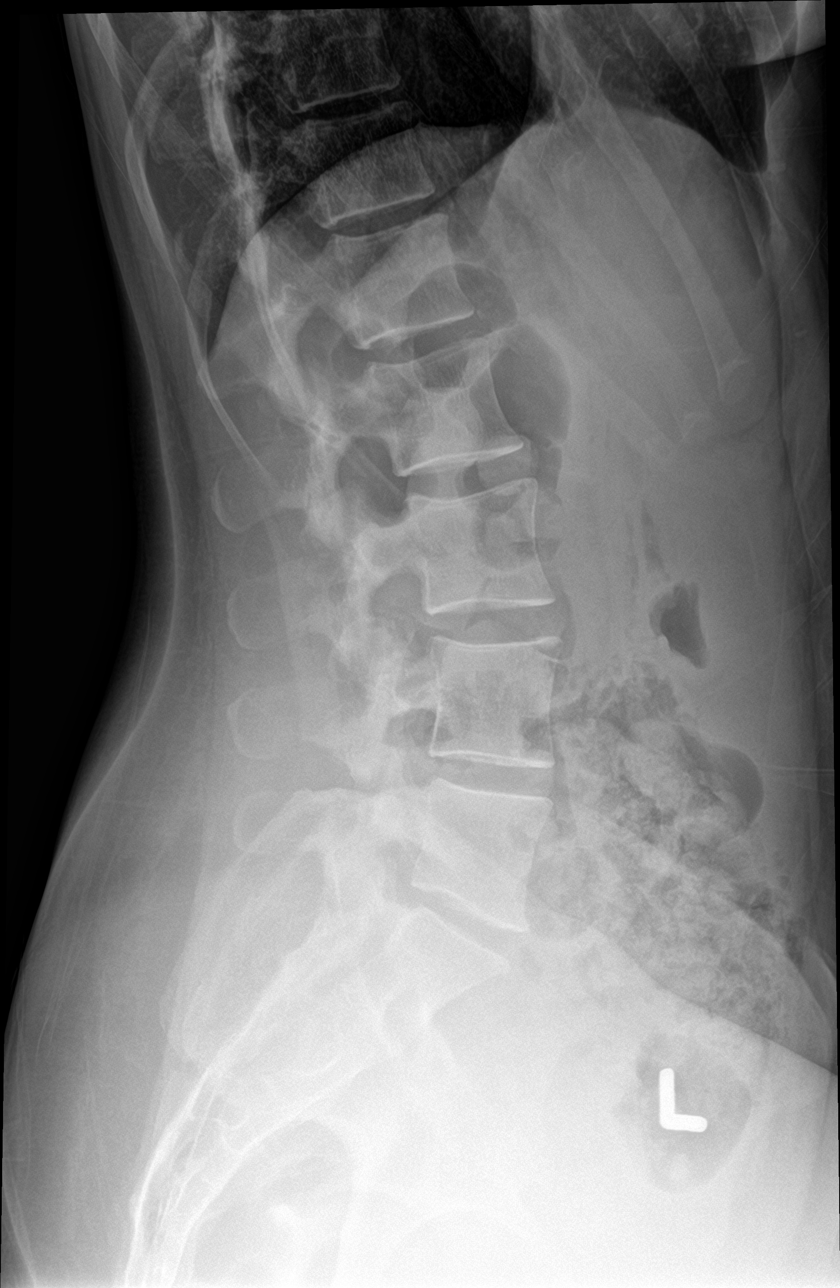

[l-spine spot]
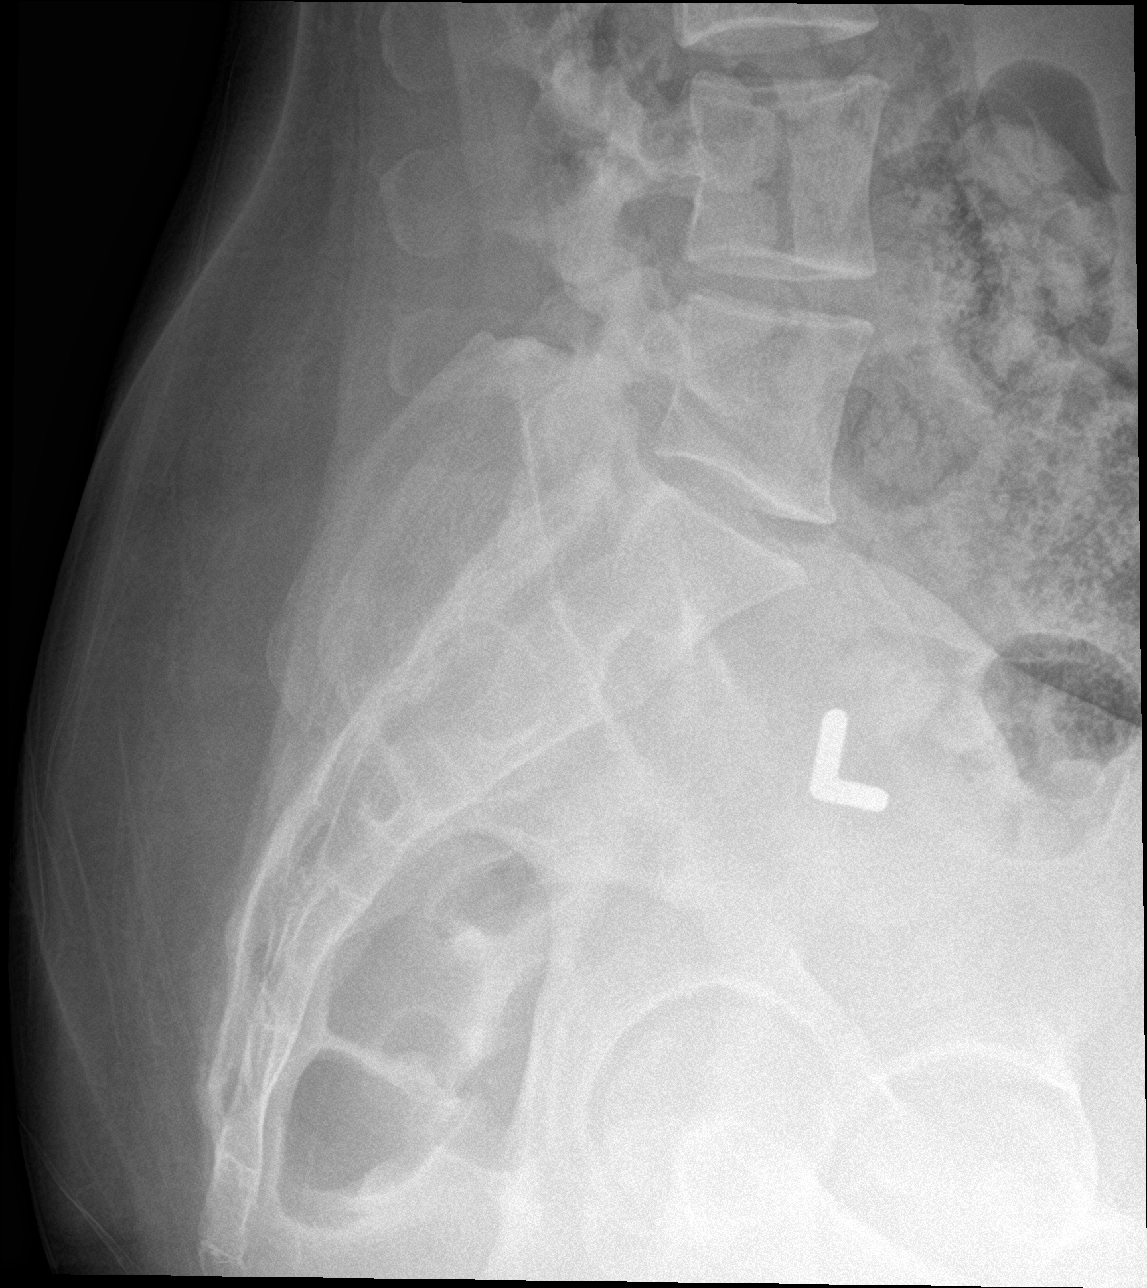

[5 of 5 positions shown; findings below may reference images not displayed]

FINDINGS: There may be minimal curvature in the upper lumbar spine. Otherwise,
alignment of the lumbar spine is normal. The vertebral body heights
and disc spaces are maintained. Negative for a pars defect. Normal
appearance of the SI joints.
IMPRESSION: No acute abnormality.

## 2019-07-30 ENCOUNTER — Other Ambulatory Visit: Payer: Self-pay

## 2019-07-31 ENCOUNTER — Encounter: Payer: Self-pay | Admitting: Obstetrics & Gynecology

## 2019-07-31 ENCOUNTER — Ambulatory Visit: Payer: 59 | Admitting: Obstetrics & Gynecology

## 2019-07-31 VITALS — BP 126/80

## 2019-07-31 DIAGNOSIS — L738 Other specified follicular disorders: Secondary | ICD-10-CM

## 2019-07-31 MED ORDER — CLINDAMYCIN HCL 300 MG PO CAPS: 300.0000 mg | ORAL_CAPSULE | Freq: Two times a day (BID) | ORAL | 0 refills | Status: AC

## 2019-07-31 NOTE — Patient Instructions (Signed)
1. Infected sebaceous gland Left vulvar/inguinal sebaceous gland abscess.  Not ready for drainage.  Recommend Sitz baths or baths in tub with warm water 3 times a day for at least 30 minutes each time.  Can add warm compresses as often as possible.  Decision to start on clindamycin 300 mg capsule twice a day for 7 days.  No allergy to antibiotics.  Usage reviewed and prescription sent to pharmacy.  Patient will follow-up if no spontaneous drainage or resolution of the abscess.  Informed that incision and drainage in the office might be needed in that case.  Other orders - clindamycin (CLEOCIN) 300 MG capsule; Take 1 capsule (300 mg total) by mouth 2 (two) times daily for 7 days.  Erleen, it was a pleasure seeing you today!

## 2019-07-31 NOTE — Progress Notes (Signed)
    Katelyn Robertson 15-Jun-1971 [id number]        48 y.o.  G2P0011 Married  RP: Left vulvar/inguinal tender bump x 3 days  HPI: Patient noticed a tender bump last Saturday, 3 days ago, while taking her shower.  Stable since then, no drainage.  No vaginal d/c.  Menses regular normal on BCPs.  Urine/BMs normal.  No fever.  Declines STI screen.   OB History  Gravida Para Term Preterm AB Living  2 1     1 1   SAB TAB Ectopic Multiple Live Births               # Outcome Date GA Lbr Len/2nd Weight Sex Delivery Anes PTL Lv  2 AB           1 Para             Past medical history,surgical history, problem list, medications, allergies, family history and social history were all reviewed and documented in the EPIC chart.   Directed ROS with pertinent positives and negatives documented in the history of present illness/assessment and plan.  Exam:  Vitals:   07/31/19 1550  BP: 126/80   General appearance:  Normal  Gynecologic exam: Left vulvar/inguinal tender cyst at the skin 1.5 x 1.5 cm, tender, mildly erythematous.  Not fluctuant enough for drainage.   Assessment/Plan:  48 y.o. G2P0011   1. Infected sebaceous gland Left vulvar/inguinal sebaceous gland abscess.  Not ready for drainage.  Recommend Sitz baths or baths in tub with warm water 3 times a day for at least 30 minutes each time.  Can add warm compresses as often as possible.  Decision to start on clindamycin 300 mg capsule twice a day for 7 days.  No allergy to antibiotics.  Usage reviewed and prescription sent to pharmacy.  Patient will follow-up if no spontaneous drainage or resolution of the abscess.  Informed that incision and drainage in the office might be needed in that case.  Other orders - clindamycin (CLEOCIN) 300 MG capsule; Take 1 capsule (300 mg total) by mouth 2 (two) times daily for 7 days.  Counseling on above issues and coordination of care more than 50% for 15 minutes.  Princess Bruins MD, 4:07 PM  07/31/2019

## 2019-08-10 ENCOUNTER — Ambulatory Visit: Payer: 59 | Admitting: Obstetrics & Gynecology

## 2019-08-27 ENCOUNTER — Ambulatory Visit
Admission: EM | Admit: 2019-08-27 | Discharge: 2019-08-27 | Disposition: A | Discharge status: Home / Self Care | Payer: 59 | Attending: Physician Assistant | Admitting: Physician Assistant

## 2019-08-27 DIAGNOSIS — R509 Fever, unspecified: Secondary | ICD-10-CM | POA: Diagnosis not present

## 2019-08-27 DIAGNOSIS — R05 Cough: Secondary | ICD-10-CM | POA: Diagnosis not present

## 2019-08-27 DIAGNOSIS — R059 Cough, unspecified: Secondary | ICD-10-CM

## 2019-08-27 DIAGNOSIS — Z20828 Contact with and (suspected) exposure to other viral communicable diseases: Secondary | ICD-10-CM | POA: Diagnosis not present

## 2019-08-27 NOTE — ED Triage Notes (Signed)
Pt c/o fever, small cough and blah feeling since Saturday.

## 2019-08-27 NOTE — Discharge Instructions (Signed)
COVID PCR testing ordered. I would like you to quarantine until testing results. You can take over the counter flonase/nasacort to help with nasal congestion/drainage. If experiencing shortness of breath, trouble breathing, go to the emergency department for further evaluation needed. 

## 2019-08-27 NOTE — ED Provider Notes (Signed)
[facility]    CSN: [id number] Arrival date & time: 08/27/19  0930      History   Chief Complaint Chief Complaint  Patient presents with  . Fever    HPI Rosha Cocker is a 48 y.o. female.   48 year old female comes in for 3 day of URI symptoms. Has had cough, fever. States she just feels "blah". Denies rhinorrhea, nasal congestion, sore throat.  T-max 101, responsive to antipyretics.  Had some chills without body aches. Denies abdominal pain, nausea, vomiting, diarrhea. Denies shortness of breath, loss of taste/smell.  Husband recently had URI symptoms, and was treated for sinusitis, no Covid testing.     History reviewed. No pertinent past medical history.  Patient Active Problem List   Diagnosis Date Noted  . Healthcare maintenance 05/02/2019  . 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 No pertinent surgical history.  OB History    Gravida  2   Para  1   Term      Preterm      AB  1   Living  1     SAB      TAB      Ectopic      Multiple      Live Births               Home Medications    Prior to Admission medications   Medication Sig Start Date End Date Taking? Authorizing Provider  norethindrone-ethinyl estradiol ([name] 7/7/7) 0.5/0.75/1-35 MG-MCG tablet Take 1 tablet by mouth daily. 05/24/19   Genia Del, MD    Family History Family History  Problem Relation Age of Onset  . Hypertension Father   . Breast cancer Maternal Grandmother     Social History Social History   Tobacco Use  . Smoking status: Never Smoker  . Smokeless tobacco: Never Used  Substance Use Topics  . Alcohol use: No  . Drug use: No     Allergies   Patient has no known allergies.   Review of Systems Review of Systems  Reason unable to perform ROS: See HPI as above.     Physical Exam Triage Vital Signs ED Triage Vitals  Enc Vitals Group     BP 08/27/19 1050 (!) 153/86     Pulse Rate 08/27/19 1050 84      Resp 08/27/19 1050 18     Temp 08/27/19 1050 97.6 F (36.4 C)     Temp Source 08/27/19 1050 Oral     SpO2 08/27/19 1050 99 %     Weight --      Height --      Head Circumference --      Peak Flow --      Pain Score 08/27/19 1051 0     Pain Loc --      Pain Edu? --      Excl. in GC? --    No data found.  Updated Vital Signs BP (!) 153/86 (BP Location: Left Arm)   Pulse 84   Temp 97.6 F (36.4 C) (Oral)   Resp 18   LMP 08/13/2019   SpO2 99%   Physical Exam Constitutional:      General: She is not in acute distress.    Appearance: Normal appearance. She is not ill-appearing, toxic-appearing or diaphoretic.  HENT:     Head: Normocephalic and atraumatic.     Mouth/Throat:     Mouth: Mucous membranes are moist.  Pharynx: Oropharynx is clear. Uvula midline.  Cardiovascular:     Rate and Rhythm: Normal rate and regular rhythm.     Heart sounds: Normal heart sounds. No murmur. No friction rub. No gallop.   Pulmonary:     Effort: Pulmonary effort is normal. No accessory muscle usage, prolonged expiration, respiratory distress or retractions.     Comments: Lungs clear to auscultation without adventitious lung sounds. Musculoskeletal:     Cervical back: Normal range of motion and neck supple.  Neurological:     General: No focal deficit present.     Mental Status: She is alert and oriented to person, place, and time.     UC Treatments / Results  Labs (all labs ordered are listed, but only abnormal results are displayed) Labs Reviewed  NOVEL CORONAVIRUS, NAA    EKG   Radiology No results found.  Procedures Procedures (including critical care time)  Medications Ordered in UC Medications - No data to display  Initial Impression / Assessment and Plan / UC Course  I have reviewed the triage vital signs and the nursing notes.  Pertinent labs & imaging results that were available during my care of the patient were reviewed by me and considered in my medical  decision making (see chart for details).    COVID PCR test ordered. Patient to quarantine until testing results return. No alarming signs on exam.  Patient speaking in full sentences without respiratory distress.  Symptomatic treatment discussed.  Push fluids.  Return precautions given.  Patient expresses understanding and agrees to plan.  Final Clinical Impressions(s) / UC Diagnoses   Final diagnoses:  Fever, unspecified  Cough   ED Prescriptions    None     PDMP not reviewed this encounter.   Ok Edwards, PA-C 08/27/19 1124

## 2019-08-28 ENCOUNTER — Other Ambulatory Visit: Payer: Self-pay

## 2019-08-28 ENCOUNTER — Telehealth: Payer: Self-pay | Admitting: Emergency Medicine

## 2019-08-28 ENCOUNTER — Ambulatory Visit (INDEPENDENT_AMBULATORY_CARE_PROVIDER_SITE_OTHER): Payer: 59 | Admitting: Adult Health

## 2019-08-28 ENCOUNTER — Encounter: Payer: Self-pay | Admitting: Adult Health

## 2019-08-28 DIAGNOSIS — Z20822 Contact with and (suspected) exposure to covid-19: Secondary | ICD-10-CM | POA: Insufficient documentation

## 2019-08-28 DIAGNOSIS — Z20828 Contact with and (suspected) exposure to other viral communicable diseases: Secondary | ICD-10-CM | POA: Diagnosis not present

## 2019-08-28 DIAGNOSIS — R509 Fever, unspecified: Secondary | ICD-10-CM

## 2019-08-28 LAB — NOVEL CORONAVIRUS, NAA: SARS-CoV-2, NAA: DETECTED — AB

## 2019-08-28 NOTE — Progress Notes (Signed)
Virtual Visit via Telephone Note  I connected with Katelyn Robertson on 08/28/19 at 10:00 AM EST by telephone and verified that I am speaking with the correct person using two identifiers.  Location: Patient: Home Provider: In Clinic   I discussed the limitations, risks, security and privacy concerns of performing an evaluation and management service by telephone and the availability of in person appointments. I also discussed with the patient that there may be a patient responsible charge related to this service. The patient expressed understanding and agreed to proceed.   History of Present Illness: Katelyn Robertson calls in with elevated temp over the weekend- highest temp 153f oral. She reports resolution of all sx's currently. She denies loss of sense of smell or taste. She reports 100% compliance of the 3 Ws. She denies known exposure Corona Virus. SARS-CoV-2 test pending from UC visit yesterday. Temp this am 98.7 f oral  Of note- she was seen in UC - 08/27/2019 "48 year old female comes in for 3 day of URI symptoms. Has had cough, fever. States she just feels "blah". Denies rhinorrhea, nasal congestion, sore throat.  T-max 101, responsive to antipyretics.  Had some chills without body aches. Denies abdominal pain, nausea, vomiting, diarrhea. Denies shortness of breath, loss of taste/smell.  Husband recently had URI symptoms, and was treated for sinusitis, no Covid testing" Patient Care Team    Relationship Specialty Notifications Start End  Esaw Grandchild, NP PCP - General Family Medicine  05/02/19     Patient Active Problem List   Diagnosis Date Noted  . Healthcare maintenance 05/02/2019  . 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 No pertinent past medical history.   History reviewed. No pertinent surgical history.   Family History  Problem Relation Age of Onset  . Hypertension Father   . Breast cancer Maternal Grandmother      Social History    Substance and Sexual Activity  Drug Use No     Social History   Substance and Sexual Activity  Alcohol Use No     Social History   Tobacco Use  Smoking Status Never Smoker  Smokeless Tobacco Never Used     Outpatient Encounter Medications as of 08/28/2019  Medication Sig  . norethindrone-ethinyl estradiol (Wink 7/7/7) 0.5/0.75/1-35 MG-MCG tablet Take 1 tablet by mouth daily.   No facility-administered encounter medications on file as of 08/28/2019.    Allergies: Patient has no known allergies.  There is no height or weight on file to calculate BMI.  Temperature 98.7 F (37.1 C), temperature source Oral, last menstrual period 08/13/2019. Review of Systems: General:   Denies fever, chills, unexplained weight loss.  Optho/Auditory:   Denies visual changes, blurred vision/LOV Respiratory:   Denies SOB, DOE more than baseline levels.  Cardiovascular:   Denies chest pain, palpitations, new onset peripheral edema  Gastrointestinal:   Denies nausea, vomiting, diarrhea.  Genitourinary: Denies dysuria, freq/ urgency, flank pain or discharge from genitals.  Endocrine:     Denies hot or cold intolerance, polyuria, polydipsia. Musculoskeletal:   Denies unexplained myalgias, joint swelling, unexplained arthralgias, gait problems.  Skin:  Denies rash, suspicious lesions Neurological:     Denies dizziness, unexplained weakness, numbness  Psychiatric/Behavioral:   Denies mood changes, suicidal or homicidal ideations, hallucinations    Observations/Objective: No acute distress noted during the telephone conversation.  Assessment and Plan: Continue to quarantine per CDC guidelines. Remain well hydrated, follow heart healthy diet. Routinely check MyChart for SARS-CoV-2 test results.  Follow Up Instructions: PRN   I discussed the assessment and treatment plan with the patient. The patient was provided an opportunity to ask questions and all were answered. The patient  agreed with the plan and demonstrated an understanding of the instructions.   The patient was advised to call back or seek an in-person evaluation if the symptoms worsen or if the condition fails to improve as anticipated.  I provided 6 minutes of non-face-to-face time during this encounter.   Julaine Fusi, NP

## 2019-08-28 NOTE — Telephone Encounter (Signed)
Your test for COVID-19 was positive, meaning that you were infected with the novel coronavirus and could give the germ to others. Please continue isolation at home for at least 10 days since the start of your symptoms. If you do not have symptoms, please isolate at home for 10 days from the day you were tested. Once you complete your 10 day quarantine, you may return to normal activities as long as you've not had a fever for over 24 hours(without taking fever reducing medicine) and your symptoms are improving. Please continue good preventive care measures, including: frequent hand-washing, avoid touching your face, cover coughs/sneezes, stay out of crowds and keep a 6 foot distance from others. Go to the nearest hospital emergency room if fever/cough/breathlessness are severe or illness seems like a threat to life.  Patient contacted by phone and made aware of  results. Pt verbalized understanding and had all questions answered.  Quarantine ends Dec 30th. 

## 2019-08-28 NOTE — Assessment & Plan Note (Signed)
Assessment and Plan: Continue to quarantine per CDC guidelines. Remain well hydrated, follow heart healthy diet. Routinely check MyChart for SARS-CoV-2 test results.  Follow Up Instructions: PRN   I discussed the assessment and treatment plan with the patient. The patient was provided an opportunity to ask questions and all were answered. The patient agreed with the plan and demonstrated an understanding of the instructions.   The patient was advised to call back or seek an in-person evaluation if the symptoms worsen or if the condition fails to improve as anticipated.

## 2020-06-09 ENCOUNTER — Encounter: Payer: 59 | Admitting: Obstetrics & Gynecology

## 2020-06-13 ENCOUNTER — Other Ambulatory Visit: Payer: Self-pay

## 2020-06-13 ENCOUNTER — Encounter: Payer: Self-pay | Admitting: Obstetrics & Gynecology

## 2020-06-13 ENCOUNTER — Ambulatory Visit (INDEPENDENT_AMBULATORY_CARE_PROVIDER_SITE_OTHER): Payer: 59 | Admitting: Obstetrics & Gynecology

## 2020-06-13 VITALS — BP 122/80 | Ht 63.0 in | Wt 120.0 lb

## 2020-06-13 DIAGNOSIS — Z3041 Encounter for surveillance of contraceptive pills: Secondary | ICD-10-CM | POA: Diagnosis not present

## 2020-06-13 DIAGNOSIS — Z01419 Encounter for gynecological examination (general) (routine) without abnormal findings: Secondary | ICD-10-CM | POA: Diagnosis not present

## 2020-06-13 MED ORDER — PIRMELLA 7/7/7 0.5/0.75/1-35 MG-MCG PO TABS: 1.0000 | ORAL_TABLET | Freq: Every day | ORAL | 4 refills | Status: DC

## 2020-06-13 NOTE — Progress Notes (Signed)
Katelyn Robertson 07/19/1971 [id number]   History:    49 y.o. G2P1A1L1 Married.  Son is 49 yo, working with his father, living with his girlfriend in his parents' basement.  RP:  Established patient presenting for annual gyn exam   HPI: Well on [name] 7/7/7.  No BTB.  No pelvic pain.  No bladder infection since started on ABprophylaxis.  No pain with IC.  Breasts normal.  BMI 21.26.  Will do Health Labs with Fam MD.  Past medical history,surgical history, family history and social history were all reviewed and documented in the EPIC chart.  Gynecologic History Patient's last menstrual period was 05/23/2020.  Obstetric History OB History  Gravida Para Term Preterm AB Living  2 1     1 1   SAB TAB Ectopic Multiple Live Births               # Outcome Date GA Lbr Len/2nd Weight Sex Delivery Anes PTL Lv  2 AB           1 Para              ROS: A ROS was performed and pertinent positives and negatives are included in the history.  GENERAL: No fevers or chills. HEENT: No change in vision, no earache, sore throat or sinus congestion. NECK: No pain or stiffness. CARDIOVASCULAR: No chest pain or pressure. No palpitations. PULMONARY: No shortness of breath, cough or wheeze. GASTROINTESTINAL: No abdominal pain, nausea, vomiting or diarrhea, melena or bright red blood per rectum. GENITOURINARY: No urinary frequency, urgency, hesitancy or dysuria. MUSCULOSKELETAL: No joint or muscle pain, no back pain, no recent trauma. DERMATOLOGIC: No rash, no itching, no lesions. ENDOCRINE: No polyuria, polydipsia, no heat or cold intolerance. No recent change in weight. HEMATOLOGICAL: No anemia or easy bruising or bleeding. NEUROLOGIC: No headache, seizures, numbness, tingling or weakness. PSYCHIATRIC: No depression, no loss of interest in normal activity or change in sleep pattern.     Exam:   BP 122/80 (BP Location: Right Arm, Patient Position: Sitting, Cuff Size: Normal)   Ht 5\' 3"  (1.6 m)   Wt 120  lb (54.4 kg)   LMP 05/23/2020   BMI 21.26 kg/m   Body mass index is 21.26 kg/m.  General appearance : Well developed well nourished female. No acute distress HEENT: Eyes: no retinal hemorrhage or exudates,  Neck supple, trachea midline, no carotid bruits, no thyroidmegaly Lungs: Clear to auscultation, no rhonchi or wheezes, or rib retractions  Heart: Regular rate and rhythm, no murmurs or gallops Breast:Examined in sitting and supine position were symmetrical in appearance, no palpable masses or tenderness,  no skin retraction, no nipple inversion, no nipple discharge, no skin discoloration, no axillary or supraclavicular lymphadenopathy Abdomen: no palpable masses or tenderness, no rebound or guarding Extremities: no edema or skin discoloration or tenderness  Pelvic: Vulva: Normal             Vagina: No gross lesions or discharge  Cervix: No gross lesions or discharge  Uterus  AV, normal size, shape and consistency, non-tender and mobile  Adnexa  Without masses or tenderness  Anus: Normal   Assessment/Plan:  49 y.o. female for annual exam   1. Well female exam with routine gynecological exam Normal gynecologic exam.  No indication for Pap test this year.  Breast exam normal.  Will schedule a screening mammogram now.  Good body mass index at 21.26.  Continue with fitness and healthy nutrition.  Health labs with family physician.  2. Encounter for surveillance of contraceptive pills Well on birth control pills.  No contraindication to continue.  Prescription sent to pharmacy.  Other orders - norethindrone-ethinyl estradiol ([name] 7/7/7) 0.5/0.75/1-35 MG-MCG tablet; Take 1 tablet by mouth daily.  Genia Del MD, 12:11 PM 06/13/2020

## 2021-06-17 ENCOUNTER — Ambulatory Visit (INDEPENDENT_AMBULATORY_CARE_PROVIDER_SITE_OTHER): Payer: 59 | Admitting: Obstetrics & Gynecology

## 2021-06-17 ENCOUNTER — Encounter: Payer: Self-pay | Admitting: Obstetrics & Gynecology

## 2021-06-17 ENCOUNTER — Other Ambulatory Visit: Payer: Self-pay

## 2021-06-17 VITALS — BP 118/80 | HR 74 | Resp 16 | Ht 62.75 in | Wt 122.0 lb

## 2021-06-17 DIAGNOSIS — Z3041 Encounter for surveillance of contraceptive pills: Secondary | ICD-10-CM | POA: Diagnosis not present

## 2021-06-17 DIAGNOSIS — Z01419 Encounter for gynecological examination (general) (routine) without abnormal findings: Secondary | ICD-10-CM | POA: Diagnosis not present

## 2021-06-17 MED ORDER — PIRMELLA 7/7/7 0.5/0.75/1-35 MG-MCG PO TABS: 1.0000 | ORAL_TABLET | Freq: Every day | ORAL | 4 refills | Status: DC

## 2021-06-17 NOTE — Progress Notes (Signed)
Katelyn Robertson 09/11/1970 [id number]   History:    50 y.o. G2P1A1L1 Married.  Son is 6 yo, working with his father, living with his girlfriend in his parents' basement.   RP:  Established patient presenting for annual gyn exam    HPI: Well on [name] 7/7/7.  No BTB.  No pelvic pain.  No bladder infection since started on ABprophylaxis.  No pain with IC.  Breasts normal.  BMI 21.78.  Will do Health Labs with Fam MD.   Past medical history,surgical history, family history and social history were all reviewed and documented in the EPIC chart.  Gynecologic History No LMP recorded. (Menstrual status: Oral contraceptives).  Obstetric History OB History  Gravida Para Term Preterm AB Living  2 1     1 1   SAB IAB Ectopic Multiple Live Births               # Outcome Date GA Lbr Len/2nd Weight Sex Delivery Anes PTL Lv  2 AB           1 Para              ROS: A ROS was performed and pertinent positives and negatives are included in the history.  GENERAL: No fevers or chills. HEENT: No change in vision, no earache, sore throat or sinus congestion. NECK: No pain or stiffness. CARDIOVASCULAR: No chest pain or pressure. No palpitations. PULMONARY: No shortness of breath, cough or wheeze. GASTROINTESTINAL: No abdominal pain, nausea, vomiting or diarrhea, melena or bright red blood per rectum. GENITOURINARY: No urinary frequency, urgency, hesitancy or dysuria. MUSCULOSKELETAL: No joint or muscle pain, no back pain, no recent trauma. DERMATOLOGIC: No rash, no itching, no lesions. ENDOCRINE: No polyuria, polydipsia, no heat or cold intolerance. No recent change in weight. HEMATOLOGICAL: No anemia or easy bruising or bleeding. NEUROLOGIC: No headache, seizures, numbness, tingling or weakness. PSYCHIATRIC: No depression, no loss of interest in normal activity or change in sleep pattern.     Exam:   BP 118/80   Pulse 74   Resp 16   Ht 5' 2.75" (1.594 m)   Wt 122 lb (55.3 kg)   BMI 21.78 kg/m    Body mass index is 21.78 kg/m.  General appearance : Well developed well nourished female. No acute distress HEENT: Eyes: no retinal hemorrhage or exudates,  Neck supple, trachea midline, no carotid bruits, no thyroidmegaly Lungs: Clear to auscultation, no rhonchi or wheezes, or rib retractions  Heart: Regular rate and rhythm, no murmurs or gallops Breast:Examined in sitting and supine position were symmetrical in appearance, no palpable masses or tenderness,  no skin retraction, no nipple inversion, no nipple discharge, no skin discoloration, no axillary or supraclavicular lymphadenopathy Abdomen: no palpable masses or tenderness, no rebound or guarding Extremities: no edema or skin discoloration or tenderness  Pelvic: Vulva: Normal             Vagina: No gross lesions or discharge  Cervix: No gross lesions or discharge  Uterus  AV, normal size, shape and consistency, non-tender and mobile  Adnexa  Without masses or tenderness  Anus: Normal   Assessment/Plan:  50 y.o. female for annual exam   1. Well female exam with routine gynecological exam Normal gynecologic exam.  No indication for Pap test this year, will repeat at 3 years.  Pap test negative in September 2020.  Breast exam normal.  Will schedule Mammo.  Recommend scheduling a colonoscopy.  Health labs with family physician.  2.  Encounter for surveillance of contraceptive pills Well on [name] 7/7/7.  No CI to continue.  Prescription sent to pharmacy.  Other orders - norethindrone-ethinyl estradiol ([name] 7/7/7) 0.5/0.75/1-35 MG-MCG tablet; Take 1 tablet by mouth daily.   Genia Del MD, 11:54 AM 06/17/2021

## 2021-06-19 ENCOUNTER — Encounter: Payer: Self-pay | Admitting: Obstetrics & Gynecology

## 2022-06-18 ENCOUNTER — Other Ambulatory Visit: Payer: Self-pay | Admitting: *Deleted

## 2022-06-18 MED ORDER — PIRMELLA 7/7/7 0.5/0.75/1-35 MG-MCG PO TABS: 1.0000 | ORAL_TABLET | Freq: Every day | ORAL | 0 refills | Status: DC

## 2022-07-20 ENCOUNTER — Other Ambulatory Visit (HOSPITAL_COMMUNITY)
Admission: RE | Admit: 2022-07-20 | Discharge: 2022-07-20 | Disposition: A | Discharge status: Home / Self Care | Payer: 59 | Source: Ambulatory Visit | Attending: Obstetrics & Gynecology | Admitting: Obstetrics & Gynecology

## 2022-07-20 ENCOUNTER — Encounter: Payer: Self-pay | Admitting: Obstetrics & Gynecology

## 2022-07-20 ENCOUNTER — Ambulatory Visit (INDEPENDENT_AMBULATORY_CARE_PROVIDER_SITE_OTHER): Payer: 59 | Admitting: Obstetrics & Gynecology

## 2022-07-20 VITALS — BP 110/72 | HR 85 | Ht 62.75 in | Wt 126.0 lb

## 2022-07-20 DIAGNOSIS — Z3041 Encounter for surveillance of contraceptive pills: Secondary | ICD-10-CM

## 2022-07-20 DIAGNOSIS — Z01419 Encounter for gynecological examination (general) (routine) without abnormal findings: Secondary | ICD-10-CM | POA: Diagnosis not present

## 2022-07-20 MED ORDER — NORETHIN ACE-ETH ESTRAD-FE 1-20 MG-MCG(24) PO TABS: 1.0000 | ORAL_TABLET | Freq: Every day | ORAL | 4 refills | Status: DC

## 2022-07-20 NOTE — Progress Notes (Signed)
Katelyn Robertson [birth year] [id number]   History:    51 y.o. G2P1A1L1 Married.  Working for [employer].  Son is 62 yo, working with his father, living with his girlfriend in his parents' basement.   RP:  Established patient presenting for annual gyn exam    HPI: Well on [name] 7/7/7.  No BTB. No hot flushes/night sweats.  No pelvic pain.  No pain with IC. Pap Neg 05/2019.  No h/o abnormal Pap.  Pap reflex today.  Breasts normal. Mammo Neg in 2016.  Overdue, schedule Mammo now.  BMI 22.50.  Will do Health Labs with Fam MD.  Flu vaccine declined.  Past medical history,surgical history, family history and social history were all reviewed and documented in the EPIC chart.  Gynecologic History Patient's last menstrual period was 07/15/2022.  Obstetric History OB History  Gravida Para Term Preterm AB Living  2 1 1   1 1   SAB IAB Ectopic Multiple Live Births    1          # Outcome Date GA Lbr Len/2nd Weight Sex Delivery Anes PTL Lv  2 IAB           1 Term             ROS: A ROS was performed and pertinent positives and negatives are included in the history. GENERAL: No fevers or chills. HEENT: No change in vision, no earache, sore throat or sinus congestion. NECK: No pain or stiffness. CARDIOVASCULAR: No chest pain or pressure. No palpitations. PULMONARY: No shortness of breath, cough or wheeze. GASTROINTESTINAL: No abdominal pain, nausea, vomiting or diarrhea, melena or bright red blood per rectum. GENITOURINARY: No urinary frequency, urgency, hesitancy or dysuria. MUSCULOSKELETAL: No joint or muscle pain, no back pain, no recent trauma. DERMATOLOGIC: No rash, no itching, no lesions. ENDOCRINE: No polyuria, polydipsia, no heat or cold intolerance. No recent change in weight. HEMATOLOGICAL: No anemia or easy bruising or bleeding. NEUROLOGIC: No headache, seizures, numbness, tingling or weakness. PSYCHIATRIC: No depression, no loss of interest in normal activity or change in sleep pattern.      Exam:   BP 110/72   Pulse 85   Ht 5' 2.75" (1.594 m)   Wt 126 lb (57.2 kg)   LMP 07/15/2022   SpO2 99%   BMI 22.50 kg/m   Body mass index is 22.5 kg/m.  General appearance : Well developed well nourished female. No acute distress HEENT: Eyes: no retinal hemorrhage or exudates,  Neck supple, trachea midline, no carotid bruits, no thyroidmegaly Lungs: Clear to auscultation, no rhonchi or wheezes, or rib retractions  Heart: Regular rate and rhythm, no murmurs or gallops Breast:Examined in sitting and supine position were symmetrical in appearance, no palpable masses or tenderness,  no skin retraction, no nipple inversion, no nipple discharge, no skin discoloration, no axillary or supraclavicular lymphadenopathy Abdomen: no palpable masses or tenderness, no rebound or guarding Extremities: no edema or skin discoloration or tenderness  Pelvic: Vulva: Normal             Vagina: No gross lesions or discharge  Cervix: No gross lesions or discharge.  Pap reflex done.  Uterus  AV, normal size, shape and consistency, non-tender and mobile  Adnexa  Without masses or tenderness  Anus: Normal   Assessment/Plan:  51 y.o. female for annual exam   1. Encounter for routine gynecological examination with Papanicolaou smear of cervix Well on [name] 7/7/7.  No BTB. No hot flushes/night sweats.  No pelvic  pain.  No pain with IC. Pap Neg 05/2019.  No h/o abnormal Pap.  Pap reflex today.  Breasts normal. Mammo Neg in 2016.  Overdue, schedule Mammo now.  BMI 22.50.  Will do Health Labs with Fam MD.  Flu vaccine declined.- Cytology  - PAP( [place])  2. Encounter for surveillance of contraceptive pills No menopausal symptom.  Will continue on BCPs, no CI.  Decrease the Estradiol dosage by changing to the generic of LoEstrin 24 Fe 1/20.  Prescription sent to pharmacy.  Patient agrees.  Other orders - Norethindrone Acetate-Ethinyl Estrad-FE (LOESTRIN 24 FE) 1-20 MG-MCG(24) tablet; Take 1 tablet  by mouth daily.   Princess Bruins MD, 1:45 PM 07/20/2022

## 2022-07-21 LAB — CYTOLOGY - PAP: Diagnosis: NEGATIVE

## 2023-08-10 ENCOUNTER — Other Ambulatory Visit: Payer: Self-pay

## 2023-08-10 NOTE — Telephone Encounter (Signed)
Medication refill request: loestrin 24 Last AEX:  07-20-22 Next AEX: 08-22-23 Last MMG (if hormonal medication request): none Refill authorized: please approve if appropriate

## 2023-08-11 MED ORDER — NORETHIN ACE-ETH ESTRAD-FE 1-20 MG-MCG(24) PO TABS: 1.0000 | ORAL_TABLET | Freq: Every day | ORAL | 0 refills | Status: DC

## 2023-08-22 ENCOUNTER — Encounter: Payer: Self-pay | Admitting: Obstetrics and Gynecology

## 2023-08-22 ENCOUNTER — Ambulatory Visit (INDEPENDENT_AMBULATORY_CARE_PROVIDER_SITE_OTHER): Payer: 59 | Admitting: Obstetrics and Gynecology

## 2023-08-22 VITALS — BP 118/80 | HR 80 | Ht 62.75 in | Wt 127.0 lb

## 2023-08-22 DIAGNOSIS — Z01419 Encounter for gynecological examination (general) (routine) without abnormal findings: Secondary | ICD-10-CM

## 2023-08-22 DIAGNOSIS — Z1211 Encounter for screening for malignant neoplasm of colon: Secondary | ICD-10-CM

## 2023-08-22 DIAGNOSIS — Z1231 Encounter for screening mammogram for malignant neoplasm of breast: Secondary | ICD-10-CM

## 2023-08-22 MED ORDER — NORETHIN ACE-ETH ESTRAD-FE 1-20 MG-MCG(24) PO TABS: 1.0000 | ORAL_TABLET | Freq: Every day | ORAL | 0 refills | Status: DC

## 2023-08-22 NOTE — Progress Notes (Signed)
52 y.o. y.o. female here for annual exam. Patient's last menstrual period was 08/07/2023 (exact date). Period Duration (Days): 2 Period Pattern: Regular Menstrual Flow: Light Menstrual Control: Panty liner Dysmenorrhea: None G2P1A1L1 Married.  Working for [employer].  Son is 47 yo, working with his father, living with his girlfriend in his parents' basement.   RP:  Established patient presenting for annual gyn exam    HPI: Well on [name] 7/7/7.  No BTB. No hot flushes/night sweats.  No pelvic pain.  No pain with IC. Pap Neg 07/2022.  No h/o abnormal Pap.   Breasts normal. Mammo Neg in 2016.  Overdue, schedule Mammo now.  BMI 22.50.  Annual labs here. Has no PCP. Flu vaccine declined. Colonoscopy not done: placed referral for cologuard today   Body mass index is 22.68 kg/m.     08/22/2023    1:33 PM 05/02/2019    2:43 PM  Depression screen PHQ 2/9  Decreased Interest 0 0  Down, Depressed, Hopeless 0 0  PHQ - 2 Score 0 0  Altered sleeping  0  Tired, decreased energy  0  Change in appetite  0  Feeling bad or failure about yourself   0  Trouble concentrating  0  Moving slowly or fidgety/restless  0  Suicidal thoughts  0  PHQ-9 Score  0    Blood pressure 118/80, pulse 80, height 5' 2.75" (1.594 m), weight 127 lb (57.6 kg), last menstrual period 08/07/2023, SpO2 99%.     Component Value Date/Time   DIAGPAP  07/20/2022 1404    - Negative for intraepithelial lesion or malignancy (NILM)   ADEQPAP  07/20/2022 1404    Satisfactory for evaluation; transformation zone component PRESENT.    GYN HISTORY:    Component Value Date/Time   DIAGPAP  07/20/2022 1404    - Negative for intraepithelial lesion or malignancy (NILM)   ADEQPAP  07/20/2022 1404    Satisfactory for evaluation; transformation zone component PRESENT.    OB History  Gravida Para Term Preterm AB Living  2 1 1  1 1   SAB IAB Ectopic Multiple Live Births   1       # Outcome Date GA Lbr Len/2nd Weight Sex Type  Anes PTL Lv  2 IAB           1 Term             History reviewed. No pertinent past medical history.  History reviewed. No pertinent surgical history.  Current Outpatient Medications on File Prior to Visit  Medication Sig Dispense Refill   Norethindrone Acetate-Ethinyl Estrad-FE (LOESTRIN 24 FE) 1-20 MG-MCG(24) tablet Take 1 tablet by mouth daily. 84 tablet 0   No current facility-administered medications on file prior to visit.    Social History   Socioeconomic History   Marital status: Married    Spouse name: Not on file   Number of children: Not on file   Years of education: Not on file   Highest education level: Not on file  Occupational History   Not on file  Tobacco Use   Smoking status: Never   Smokeless tobacco: Never  Vaping Use   Vaping status: Never Used  Substance and Sexual Activity   Alcohol use: No   Drug use: No   Sexual activity: Yes    Partners: Male    Birth control/protection: OCP    Comment: 1st intercourse- 18, partners- 1  Other Topics Concern   Not on file  Social History  Narrative   Not on file   Social Drivers of Health   Financial Resource Strain: Not on file  Food Insecurity: Not on file  Transportation Needs: Not on file  Physical Activity: Not on file  Stress: Not on file  Social Connections: Not on file  Intimate Partner Violence: Not on file    Family History  Problem Relation Age of Onset   Hypertension Father    Breast cancer Maternal Grandmother      No Known Allergies    Patient's last menstrual period was Patient's last menstrual period was 08/07/2023 (exact date)..            Review of Systems Alls systems reviewed and are negative.     OBGyn Exam    A:         Well Woman GYN exam                             P:        Pap smear not indicated Encouraged annual mammogram screening Colon cancer screening referral placed today  Counseled on the importance of the testing for colon cancer screening.   Cologuard referral sent. DXA not indicated Labs and immunizations ordered today Refill sent on ocp's Encouraged healthy lifestyle practices   No follow-ups on file.  Earley Favor

## 2023-08-23 LAB — LIPID PANEL
Cholesterol: 209 mg/dL — ABNORMAL HIGH (ref <200)
HDL: 61 mg/dL (ref >=50)
LDL Cholesterol (Calc): 126 mg/dL — ABNORMAL HIGH
Non-HDL Cholesterol (Calc): 148 mg/dL — ABNORMAL HIGH (ref <130)
Total CHOL/HDL Ratio: 3.4 (calc) (ref <5.0)
Triglycerides: 114 mg/dL (ref <150)

## 2023-08-23 LAB — HEMOGLOBIN A1C
Hgb A1c MFr Bld: 5.6 %{Hb} (ref <5.7)
Mean Plasma Glucose: 114 mg/dL
eAG (mmol/L): 6.3 mmol/L

## 2023-08-23 LAB — CBC
HCT: 42.1 % (ref 35.0–45.0)
Hemoglobin: 13.9 g/dL (ref 11.7–15.5)
MCH: 30.8 pg (ref 27.0–33.0)
MCHC: 33 g/dL (ref 32.0–36.0)
MCV: 93.1 fL (ref 80.0–100.0)
MPV: 10.9 fL (ref 7.5–12.5)
Platelets: 482 10*3/uL — ABNORMAL HIGH (ref 140–400)
RBC: 4.52 10*6/uL (ref 3.80–5.10)
RDW: 11.8 % (ref 11.0–15.0)
WBC: 9.3 10*3/uL (ref 3.8–10.8)

## 2023-08-23 LAB — COMPREHENSIVE METABOLIC PANEL
AG Ratio: 1.4 (calc) (ref 1.0–2.5)
ALT: 13 U/L (ref 6–29)
AST: 17 U/L (ref 10–35)
Albumin: 4.3 g/dL (ref 3.6–5.1)
Alkaline phosphatase (APISO): 83 U/L (ref 37–153)
BUN: 11 mg/dL (ref 7–25)
CO2: 22 mmol/L (ref 20–32)
Calcium: 9.4 mg/dL (ref 8.6–10.4)
Chloride: 102 mmol/L (ref 98–110)
Creat: 0.89 mg/dL (ref 0.50–1.03)
Globulin: 3.1 g/dL (ref 1.9–3.7)
Glucose, Bld: 74 mg/dL (ref 65–99)
Potassium: 4.5 mmol/L (ref 3.5–5.3)
Sodium: 140 mmol/L (ref 135–146)
Total Bilirubin: 1 mg/dL (ref 0.2–1.2)
Total Protein: 7.4 g/dL (ref 6.1–8.1)

## 2023-08-23 LAB — VITAMIN D 25 HYDROXY (VIT D DEFICIENCY, FRACTURES): Vit D, 25-Hydroxy: 9 ng/mL — ABNORMAL LOW (ref 30–100)

## 2023-08-23 LAB — TSH: TSH: 1.68 m[IU]/L

## 2023-08-24 ENCOUNTER — Other Ambulatory Visit: Payer: Self-pay

## 2023-08-24 DIAGNOSIS — O1424 HELLP syndrome, complicating childbirth: Secondary | ICD-10-CM

## 2023-08-24 DIAGNOSIS — R7989 Other specified abnormal findings of blood chemistry: Secondary | ICD-10-CM

## 2023-08-24 DIAGNOSIS — E559 Vitamin D deficiency, unspecified: Secondary | ICD-10-CM

## 2023-08-24 DIAGNOSIS — D75839 Thrombocytosis, unspecified: Secondary | ICD-10-CM

## 2023-08-24 MED ORDER — VITAMIN D (ERGOCALCIFEROL) 1.25 MG (50000 UNIT) PO CAPS: 50000.0000 [IU] | ORAL_CAPSULE | ORAL | 0 refills | Status: AC

## 2023-09-01 ENCOUNTER — Telehealth: Payer: Self-pay | Admitting: Hematology and Oncology

## 2023-09-01 NOTE — Telephone Encounter (Signed)
Called and left VM for patient to call back to schedule an appointment.

## 2023-10-13 ENCOUNTER — Other Ambulatory Visit: Payer: Self-pay

## 2023-10-13 ENCOUNTER — Encounter: Payer: Self-pay | Admitting: Obstetrics and Gynecology

## 2023-10-13 DIAGNOSIS — Z01419 Encounter for gynecological examination (general) (routine) without abnormal findings: Secondary | ICD-10-CM

## 2023-10-13 DIAGNOSIS — Z1231 Encounter for screening mammogram for malignant neoplasm of breast: Secondary | ICD-10-CM

## 2023-10-13 LAB — COLOGUARD: COLOGUARD: NEGATIVE

## 2023-10-13 NOTE — Telephone Encounter (Signed)
 This is Dr. Nikki covering for Dr. Glennon.   The patient needs a recent mammogram to refill her birth control pills further.   If she has had a mammogram in the last year, please forward it to Dr. Glennon.   Bobie Nikki, MD  No answer, LDVM on machine per DPR notifying her of recommendations.

## 2023-10-13 NOTE — Telephone Encounter (Signed)
 EB pt LVM in triage line stating that she picked up her last refill of her BCPs and noticed on the bottle that it said no more refills available. However, she reported that she always gets refills x 23yr after AEX and desired to know why not?  Med refill request: BCPs Last AEX: 08/22/2023-EB Next AEX: recall placed for 08/2024 Last MMG (if hormonal med): no mammo since 2016. Nothing scheduled. Refill authorized: rx pend.   Please advise.

## 2023-10-20 MED ORDER — NORETHIN ACE-ETH ESTRAD-FE 1-20 MG-MCG(24) PO TABS: 1.0000 | ORAL_TABLET | Freq: Every day | ORAL | 6 refills | Status: DC

## 2023-10-20 NOTE — Telephone Encounter (Signed)
Per review of EPIC, refill authorized by Dr. Karma Greaser.   Screening MMG scheduled for 11/08/23.

## 2023-10-20 NOTE — Telephone Encounter (Signed)
FYI. Pt not scheduled to do for mammo. Order amended for screening mammo to TBC. TBC should attempt to reach out to the pt to schedule.

## 2023-11-02 NOTE — Telephone Encounter (Signed)
 Pt LVM in triage line notifying us that she made the appt for mammo and is requesting the refill again prior to appt since about to run out of meds.   Pt notified and voiced understanding that rx was sent by EB on 10/20/2023.

## 2023-11-08 ENCOUNTER — Ambulatory Visit
Admission: RE | Admit: 2023-11-08 | Discharge: 2023-11-08 | Disposition: A | Discharge status: Home / Self Care | Payer: 59 | Source: Ambulatory Visit | Attending: Obstetrics and Gynecology | Admitting: Obstetrics and Gynecology

## 2023-11-08 DIAGNOSIS — Z01419 Encounter for gynecological examination (general) (routine) without abnormal findings: Secondary | ICD-10-CM

## 2023-11-08 DIAGNOSIS — Z1231 Encounter for screening mammogram for malignant neoplasm of breast: Secondary | ICD-10-CM

## 2023-11-11 ENCOUNTER — Encounter: Payer: Self-pay | Admitting: Obstetrics and Gynecology

## 2024-02-01 ENCOUNTER — Other Ambulatory Visit: Payer: Self-pay

## 2024-02-01 ENCOUNTER — Other Ambulatory Visit

## 2024-02-01 ENCOUNTER — Telehealth: Payer: Self-pay

## 2024-02-01 DIAGNOSIS — R102 Pelvic and perineal pain: Secondary | ICD-10-CM

## 2024-02-01 NOTE — Telephone Encounter (Signed)
 Patient scheduled for lab visit today at 2pm.

## 2024-02-01 NOTE — Telephone Encounter (Signed)
 Patient is reporting that yesterday when she urinated she had pressure. She states that in the night the pressure became constant and she feels like she is urinating more frequently. Patient is requesting medication to be called in to the [facility] in [place]. Please advise.

## 2024-02-01 NOTE — Telephone Encounter (Signed)
 Patient called requesting medication for UTI. Placed call to patient and left message asking about symptoms.

## 2024-02-02 ENCOUNTER — Ambulatory Visit: Payer: Self-pay | Admitting: Obstetrics and Gynecology

## 2024-02-02 DIAGNOSIS — R102 Pelvic and perineal pain: Secondary | ICD-10-CM

## 2024-02-02 MED ORDER — NITROFURANTOIN MONOHYD MACRO 100 MG PO CAPS: 100.0000 mg | ORAL_CAPSULE | Freq: Two times a day (BID) | ORAL | 0 refills | Status: DC

## 2024-02-03 LAB — URINALYSIS, COMPLETE W/RFL CULTURE
Bilirubin Urine: NEGATIVE
Glucose, UA: NEGATIVE
Hyaline Cast: NONE SEEN /LPF
Ketones, ur: NEGATIVE
Nitrites, Initial: NEGATIVE
Specific Gravity, Urine: 1.022 (ref 1.001–1.035)
pH: 5.5 (ref 5.0–8.0)

## 2024-02-03 LAB — URINE CULTURE
MICRO NUMBER:: 16511625
SPECIMEN QUALITY:: ADEQUATE

## 2024-02-03 LAB — CULTURE INDICATED

## 2024-02-06 MED ORDER — FLUCONAZOLE 150 MG PO TABS: 150.0000 mg | ORAL_TABLET | Freq: Once | ORAL | 0 refills | Status: AC

## 2024-02-06 MED ORDER — SULFAMETHOXAZOLE-TRIMETHOPRIM 800-160 MG PO TABS: 1.0000 | ORAL_TABLET | Freq: Two times a day (BID) | ORAL | 0 refills | Status: AC

## 2024-02-06 NOTE — Addendum Note (Signed)
 Addended by: Reinaldo Caras on: 02/06/2024 04:56 PM   Modules accepted: Orders

## 2024-02-06 NOTE — Telephone Encounter (Signed)
 Bactrim  sent to pharmacy on file. Please advise on diflucan Rx.

## 2024-02-06 NOTE — Addendum Note (Signed)
 Addended by: Damiana Berrian N on: 02/06/2024 03:42 PM   Modules accepted: Orders

## 2024-09-11 ENCOUNTER — Ambulatory Visit (INDEPENDENT_AMBULATORY_CARE_PROVIDER_SITE_OTHER): Admitting: Obstetrics and Gynecology

## 2024-09-11 ENCOUNTER — Encounter: Payer: Self-pay | Admitting: Obstetrics and Gynecology

## 2024-09-11 VITALS — BP 132/78 | HR 80 | Ht 63.5 in | Wt 122.0 lb

## 2024-09-11 DIAGNOSIS — Z01419 Encounter for gynecological examination (general) (routine) without abnormal findings: Secondary | ICD-10-CM | POA: Diagnosis not present

## 2024-09-11 DIAGNOSIS — Z1331 Encounter for screening for depression: Secondary | ICD-10-CM | POA: Diagnosis not present

## 2024-09-11 MED ORDER — NORETHIN ACE-ETH ESTRAD-FE 1-20 MG-MCG(24) PO TABS: 1.0000 | ORAL_TABLET | Freq: Every day | ORAL | 6 refills | Status: AC

## 2024-09-11 MED ORDER — PIRMELLA 7/7/7 0.5/0.75/1-35 MG-MCG PO TABS: 1.0000 | ORAL_TABLET | Freq: Every day | ORAL | 11 refills | Status: DC

## 2024-09-11 NOTE — Progress Notes (Signed)
 "   54 y.o. y.o. female here for annual exam. Patient's last menstrual period was 09/08/2024 (exact date). Period Duration (Days): 3 Menstrual Flow: Light Menstrual Control: Maxi pad Menstrual Control Change Freq (Hours): once a day Dysmenorrhea: (!) Mild Dysmenorrhea Symptoms: Headache  G2P1A1L1 Married.  Working for [employer].  Son is 17 yo, working with his father, living with his girlfriend in his parents' basement.   RP:  Established patient presenting for annual gyn exam    HPI: Well on loestrin  No BTB. No hot flushes/night sweats.  No pelvic pain.  No pain with IC. Pap Neg 07/2022.  No h/o abnormal Pap.   Breasts normal. MMG 11/08/23. BMI 22.50 last visit.  Annual labs here. Has no PCP. Flu vaccine declined. Cologuard: 10/05/23 Body mass index is 21.27 kg/m.     09/11/2024    3:07 PM 08/22/2023    1:33 PM 05/02/2019    2:43 PM  Depression screen PHQ 2/9  Decreased Interest 0 0 0  Down, Depressed, Hopeless 0 0 0  PHQ - 2 Score 0 0 0  Altered sleeping   0  Tired, decreased energy   0  Change in appetite   0  Feeling bad or failure about yourself    0  Trouble concentrating   0  Moving slowly or fidgety/restless   0  Suicidal thoughts   0  PHQ-9 Score   0      Data saved with a previous flowsheet row definition    Blood pressure 132/78, pulse 80, height 5' 3.5 (1.613 m), weight 122 lb (55.3 kg), last menstrual period 09/08/2024, SpO2 99%.     Component Value Date/Time   DIAGPAP  07/20/2022 1404    - Negative for intraepithelial lesion or malignancy (NILM)   ADEQPAP  07/20/2022 1404    Satisfactory for evaluation; transformation zone component PRESENT.    GYN HISTORY:    Component Value Date/Time   DIAGPAP  07/20/2022 1404    - Negative for intraepithelial lesion or malignancy (NILM)   ADEQPAP  07/20/2022 1404    Satisfactory for evaluation; transformation zone component PRESENT.    OB History  Gravida Para Term Preterm AB Living  2 1 1  1 1   SAB IAB Ectopic  Multiple Live Births   1       # Outcome Date GA Lbr Len/2nd Weight Sex Type Anes PTL Lv  2 IAB           1 Term             History reviewed. No pertinent past medical history.  History reviewed. No pertinent surgical history.  Medications Ordered Prior to Encounter[1]  Social History   Socioeconomic History   Marital status: Married    Spouse name: Not on file   Number of children: Not on file   Years of education: Not on file   Highest education level: Not on file  Occupational History   Not on file  Tobacco Use   Smoking status: Never   Smokeless tobacco: Never  Vaping Use   Vaping status: Never Used  Substance and Sexual Activity   Alcohol use: No   Drug use: No   Sexual activity: Yes    Partners: Male    Birth control/protection: OCP    Comment: 1st intercourse- 18, partners- 1  Other Topics Concern   Not on file  Social History Narrative   Not on file   Social Drivers of Health   Tobacco  Use: Low Risk (09/11/2024)   Patient History    Smoking Tobacco Use: Never    Smokeless Tobacco Use: Never    Passive Exposure: Not on file  Financial Resource Strain: Not on file  Food Insecurity: Not on file  Transportation Needs: Not on file  Physical Activity: Not on file  Stress: Not on file  Social Connections: Not on file  Intimate Partner Violence: Not on file  Depression (PHQ2-9): Low Risk (09/11/2024)   Depression (PHQ2-9)    PHQ-2 Score: 0  Alcohol Screen: Not on file  Housing: Not on file  Utilities: Not on file  Health Literacy: Not on file    Family History  Problem Relation Age of Onset   Hypertension Father    Breast cancer Maternal Grandmother      Allergies[2]    Patient's last menstrual period was Patient's last menstrual period was 09/08/2024 (exact date)..             Review of Systems Alls systems reviewed and are negative.     Physical Exam Constitutional:      Appearance: Normal appearance.  Genitourinary:     Vulva and  urethral meatus normal.     No lesions in the vagina.     Right Labia: No rash, lesions or skin changes.    Left Labia: No lesions, skin changes or rash.    No vaginal discharge or tenderness.     No vaginal prolapse present.    No vaginal atrophy present.     Right Adnexa: not tender, not palpable and no mass present.    Left Adnexa: not tender, not palpable and no mass present.    No cervical motion tenderness or discharge.     Uterus is not enlarged, tender or irregular.  Breasts:    Right: Normal.     Left: Normal.  HENT:     Head: Normocephalic.  Neck:     Thyroid: No thyroid mass, thyromegaly or thyroid tenderness.  Cardiovascular:     Rate and Rhythm: Normal rate and regular rhythm.     Heart sounds: Normal heart sounds, S1 normal and S2 normal.  Pulmonary:     Effort: Pulmonary effort is normal.     Breath sounds: Normal breath sounds and air entry.  Abdominal:     General: There is no distension.     Palpations: Abdomen is soft. There is no mass.     Tenderness: There is no abdominal tenderness. There is no guarding or rebound.  Musculoskeletal:        General: Normal range of motion.     Cervical back: Full passive range of motion without pain, normal range of motion and neck supple. No tenderness.     Right lower leg: No edema.     Left lower leg: No edema.  Neurological:     Mental Status: She is alert.  Skin:    General: Skin is warm.  Psychiatric:        Mood and Affect: Mood normal.        Behavior: Behavior normal.        Thought Content: Thought content normal.  Vitals and nursing note reviewed. Exam conducted with a chaperone present.       A:         Well Woman GYN exam                             P:  Pap smear not indicated Encouraged annual mammogram screening Colon cancer screening up-to-date DXA not indicated Labs and immunizations ordered today Discussed breast self exams Encouraged healthy lifestyle practices Encouraged Vit D  and Calcium   No follow-ups on file.  Katelyn Robertson     [1]  Current Outpatient Medications on File Prior to Visit  Medication Sig Dispense Refill   Norethindrone Acetate-Ethinyl Estrad-FE (LOESTRIN 24 FE) 1-20 MG-MCG(24) tablet Take 1 tablet by mouth daily. 84 tablet 6   Vitamin D , Ergocalciferol , (DRISDOL ) 1.25 MG (50000 UNIT) CAPS capsule Take 1 capsule (50,000 Units total) by mouth every 7 (seven) days. (Patient not taking: Reported on 09/11/2024) 4 capsule 0   No current facility-administered medications on file prior to visit.  [2] No Known Allergies  "

## 2024-09-11 NOTE — Progress Notes (Signed)
 No questions or concerns at this time

## 2024-09-12 ENCOUNTER — Ambulatory Visit: Payer: Self-pay | Admitting: Obstetrics and Gynecology

## 2024-09-12 DIAGNOSIS — R7989 Other specified abnormal findings of blood chemistry: Secondary | ICD-10-CM

## 2024-09-12 LAB — COMPREHENSIVE METABOLIC PANEL WITH GFR
AG Ratio: 1.5 (calc) (ref 1.0–2.5)
ALT: 13 U/L (ref 6–29)
AST: 18 U/L (ref 10–35)
Albumin: 4.3 g/dL (ref 3.6–5.1)
Alkaline phosphatase (APISO): 86 U/L (ref 37–153)
BUN: 12 mg/dL (ref 7–25)
CO2: 24 mmol/L (ref 20–32)
Calcium: 9.2 mg/dL (ref 8.6–10.4)
Chloride: 102 mmol/L (ref 98–110)
Creat: 0.93 mg/dL (ref 0.50–1.03)
Globulin: 2.8 g/dL (ref 1.9–3.7)
Glucose, Bld: 93 mg/dL (ref 65–99)
Potassium: 4.1 mmol/L (ref 3.5–5.3)
Sodium: 138 mmol/L (ref 135–146)
Total Bilirubin: 0.8 mg/dL (ref 0.2–1.2)
Total Protein: 7.1 g/dL (ref 6.1–8.1)
eGFR: 74 mL/min/1.73m2

## 2024-09-12 LAB — CBC
HCT: 42.8 % (ref 35.9–46.0)
Hemoglobin: 14.4 g/dL (ref 11.7–15.5)
MCH: 31.4 pg (ref 27.0–33.0)
MCHC: 33.6 g/dL (ref 31.6–35.4)
MCV: 93.2 fL (ref 81.4–101.7)
MPV: 10.8 fL (ref 7.5–12.5)
Platelets: 502 Thousand/uL — ABNORMAL HIGH (ref 140–400)
RBC: 4.59 Million/uL (ref 3.80–5.10)
RDW: 12 % (ref 11.0–15.0)
WBC: 8 Thousand/uL (ref 3.8–10.8)

## 2024-09-12 LAB — LIPID PANEL
Cholesterol: 237 mg/dL — ABNORMAL HIGH
HDL: 66 mg/dL
LDL Cholesterol (Calc): 145 mg/dL — ABNORMAL HIGH
Non-HDL Cholesterol (Calc): 171 mg/dL — ABNORMAL HIGH
Total CHOL/HDL Ratio: 3.6 (calc)
Triglycerides: 136 mg/dL

## 2024-09-12 LAB — HEMOGLOBIN A1C
Hgb A1c MFr Bld: 5.4 %
Mean Plasma Glucose: 108 mg/dL
eAG (mmol/L): 6 mmol/L

## 2024-09-12 LAB — VITAMIN D 25 HYDROXY (VIT D DEFICIENCY, FRACTURES): Vit D, 25-Hydroxy: 40 ng/mL (ref 30–100)

## 2024-09-12 LAB — TSH: TSH: 2.21 m[IU]/L

## 2024-09-14 NOTE — Progress Notes (Signed)
 Referral placed for Hematology

## 2024-10-10 ENCOUNTER — Telehealth: Payer: Self-pay

## 2024-10-10 NOTE — Telephone Encounter (Signed)
 Patient had appt for aex 09-11-24. She called today to verify the right birth control was sent in for her. She is aware that loestrin 1/20 (24) was sent in for her.
